# Patient Record
Sex: Male | Born: 1955 | Race: White | Hispanic: No | Marital: Single | Smoking: Current every day smoker
Health system: Southern US, Community
[De-identification: ages and names within clinical notes are randomized; demographics above are authoritative.]

## PROBLEM LIST (undated history)

## (undated) DIAGNOSIS — I10 Essential (primary) hypertension: Secondary | ICD-10-CM

## (undated) DIAGNOSIS — M1990 Unspecified osteoarthritis, unspecified site: Secondary | ICD-10-CM

## (undated) DIAGNOSIS — Z800 Family history of malignant neoplasm of digestive organs: Secondary | ICD-10-CM

## (undated) DIAGNOSIS — D126 Benign neoplasm of colon, unspecified: Secondary | ICD-10-CM

## (undated) DIAGNOSIS — D1391 Familial adenomatous polyposis: Secondary | ICD-10-CM

## (undated) DIAGNOSIS — J449 Chronic obstructive pulmonary disease, unspecified: Secondary | ICD-10-CM

## (undated) DIAGNOSIS — H9319 Tinnitus, unspecified ear: Secondary | ICD-10-CM

## (undated) DIAGNOSIS — J189 Pneumonia, unspecified organism: Secondary | ICD-10-CM

## (undated) HISTORY — DX: Benign neoplasm of colon, unspecified: D12.6

## (undated) HISTORY — DX: Family history of malignant neoplasm of digestive organs: Z80.0

## (undated) HISTORY — DX: Essential (primary) hypertension: I10

## (undated) HISTORY — DX: Familial adenomatous polyposis: D13.91

## (undated) HISTORY — PX: MULTIPLE TOOTH EXTRACTIONS: SHX2053

---

## 2004-06-29 ENCOUNTER — Ambulatory Visit: Payer: Self-pay | Admitting: Internal Medicine

## 2004-06-29 ENCOUNTER — Inpatient Hospital Stay (HOSPITAL_COMMUNITY): Admission: AD | Admit: 2004-06-29 | Discharge: 2004-07-15 | Payer: Self-pay

## 2004-06-29 ENCOUNTER — Encounter: Payer: Self-pay | Admitting: Emergency Medicine

## 2004-07-30 ENCOUNTER — Ambulatory Visit: Payer: Self-pay | Admitting: Critical Care Medicine

## 2004-08-28 ENCOUNTER — Encounter: Admission: RE | Admit: 2004-08-28 | Discharge: 2004-08-28 | Payer: Self-pay | Admitting: Critical Care Medicine

## 2004-09-05 ENCOUNTER — Ambulatory Visit: Payer: Self-pay | Admitting: Critical Care Medicine

## 2010-08-17 ENCOUNTER — Encounter: Payer: Self-pay | Admitting: Thoracic Surgery

## 2010-08-18 ENCOUNTER — Encounter: Payer: Self-pay | Admitting: Thoracic Surgery

## 2010-08-18 ENCOUNTER — Encounter: Payer: Self-pay | Admitting: Critical Care Medicine

## 2014-04-18 ENCOUNTER — Other Ambulatory Visit (HOSPITAL_COMMUNITY): Payer: Self-pay | Admitting: Family Medicine

## 2014-04-18 ENCOUNTER — Ambulatory Visit (HOSPITAL_COMMUNITY)
Admission: RE | Admit: 2014-04-18 | Discharge: 2014-04-18 | Disposition: A | Discharge status: Home / Self Care | Payer: 59 | Source: Ambulatory Visit | Attending: Family Medicine | Admitting: Family Medicine

## 2014-04-18 DIAGNOSIS — R079 Chest pain, unspecified: Secondary | ICD-10-CM | POA: Diagnosis not present

## 2014-04-18 DIAGNOSIS — R918 Other nonspecific abnormal finding of lung field: Secondary | ICD-10-CM

## 2014-04-18 DIAGNOSIS — J449 Chronic obstructive pulmonary disease, unspecified: Secondary | ICD-10-CM | POA: Insufficient documentation

## 2014-04-18 DIAGNOSIS — J4489 Other specified chronic obstructive pulmonary disease: Secondary | ICD-10-CM | POA: Insufficient documentation

## 2015-07-10 ENCOUNTER — Ambulatory Visit (HOSPITAL_COMMUNITY)
Admission: RE | Admit: 2015-07-10 | Discharge: 2015-07-10 | Disposition: A | Discharge status: Home / Self Care | Payer: Managed Care, Other (non HMO) | Source: Ambulatory Visit | Attending: Family Medicine | Admitting: Family Medicine

## 2015-07-10 ENCOUNTER — Other Ambulatory Visit (HOSPITAL_COMMUNITY): Payer: Self-pay | Admitting: Family Medicine

## 2015-07-10 DIAGNOSIS — R05 Cough: Secondary | ICD-10-CM | POA: Diagnosis present

## 2015-07-10 DIAGNOSIS — R918 Other nonspecific abnormal finding of lung field: Secondary | ICD-10-CM

## 2016-04-29 ENCOUNTER — Ambulatory Visit (INDEPENDENT_AMBULATORY_CARE_PROVIDER_SITE_OTHER): Payer: Managed Care, Other (non HMO)

## 2016-04-29 DIAGNOSIS — Z23 Encounter for immunization: Secondary | ICD-10-CM | POA: Diagnosis not present

## 2017-05-20 DIAGNOSIS — Z23 Encounter for immunization: Secondary | ICD-10-CM | POA: Diagnosis not present

## 2019-05-18 ENCOUNTER — Other Ambulatory Visit: Payer: Self-pay

## 2019-05-18 DIAGNOSIS — Z20822 Contact with and (suspected) exposure to covid-19: Secondary | ICD-10-CM

## 2019-05-19 LAB — NOVEL CORONAVIRUS, NAA: SARS-CoV-2, NAA: NOT DETECTED

## 2019-05-23 ENCOUNTER — Telehealth: Payer: Self-pay | Admitting: Hematology

## 2019-05-23 NOTE — Telephone Encounter (Signed)
Patient called in stating his employer is needing results faxed to [phone] or [phone], as he will not accept it off of mychart. Please advise and place to attention of Ileene Musa.

## 2019-05-23 NOTE — Telephone Encounter (Signed)
Pt is aware covid 19 result is neg

## 2019-08-29 ENCOUNTER — Encounter: Payer: Self-pay | Admitting: Internal Medicine

## 2019-09-13 ENCOUNTER — Encounter: Payer: Self-pay | Admitting: Internal Medicine

## 2019-09-13 ENCOUNTER — Telehealth: Payer: Self-pay | Admitting: Internal Medicine

## 2019-09-13 ENCOUNTER — Ambulatory Visit: Payer: Managed Care, Other (non HMO) | Admitting: Gastroenterology

## 2019-09-13 NOTE — Progress Notes (Deleted)
Primary Care Physician:  Lucia Gaskins, MD  Primary Gastroenterologist:  Garfield Cornea, MD   No chief complaint on file.   HPI:  Jason Mclaughlin is a 64 y.o. male here   No current outpatient medications on file.   No current facility-administered medications for this visit.    Allergies as of 09/13/2019  . (Not on File)    No past medical history on file.  *** The histories are not reviewed yet. Please review them in the "History" navigator section and refresh this [place].  No family history on file.  Social History   Socioeconomic History  . Marital status: Single    Spouse name: Not on file  . Number of children: Not on file  . Years of education: Not on file  . Highest education level: Not on file  Occupational History  . Not on file  Tobacco Use  . Smoking status: Not on file  Substance and Sexual Activity  . Alcohol use: Not on file  . Drug use: Not on file  . Sexual activity: Not on file  Other Topics Concern  . Not on file  Social History Narrative  . Not on file   Social Determinants of Health   Financial Resource Strain:   . Difficulty of Paying Living Expenses: Not on file  Food Insecurity:   . Worried About Charity fundraiser in the Last Year: Not on file  . Ran Out of Food in the Last Year: Not on file  Transportation Needs:   . Lack of Transportation (Medical): Not on file  . Lack of Transportation (Non-Medical): Not on file  Physical Activity:   . Days of Exercise per Week: Not on file  . Minutes of Exercise per Session: Not on file  Stress:   . Feeling of Stress : Not on file  Social Connections:   . Frequency of Communication with Friends and Family: Not on file  . Frequency of Social Gatherings with Friends and Family: Not on file  . Attends Religious Services: Not on file  . Active Member of Clubs or Organizations: Not on file  . Attends Archivist Meetings: Not on file  . Marital Status: Not on file  Intimate  Partner Violence:   . Fear of Current or Ex-Partner: Not on file  . Emotionally Abused: Not on file  . Physically Abused: Not on file  . Sexually Abused: Not on file      ROS:  General: Negative for anorexia, weight loss, fever, chills, fatigue, weakness. Eyes: Negative for vision changes.  ENT: Negative for hoarseness, difficulty swallowing , nasal congestion. CV: Negative for chest pain, angina, palpitations, dyspnea on exertion, peripheral edema.  Respiratory: Negative for dyspnea at rest, dyspnea on exertion, cough, sputum, wheezing.  GI: See history of present illness. GU:  Negative for dysuria, hematuria, urinary incontinence, urinary frequency, nocturnal urination.  MS: Negative for joint pain, low back pain.  Derm: Negative for rash or itching.  Neuro: Negative for weakness, abnormal sensation, seizure, frequent headaches, memory loss, confusion.  Psych: Negative for anxiety, depression, suicidal ideation, hallucinations.  Endo: Negative for unusual weight change.  Heme: Negative for bruising or bleeding. Allergy: Negative for rash or hives.    Physical Examination:  There were no vitals taken for this visit.   General: Well-nourished, well-developed in no acute distress.  Head: Normocephalic, atraumatic.   Eyes: Conjunctiva pink, no icterus. Mouth: Oropharyngeal mucosa moist and pink , no lesions erythema or exudate. Neck: Supple  without thyromegaly, masses, or lymphadenopathy.  Lungs: Clear to auscultation bilaterally.  Heart: Regular rate and rhythm, no murmurs rubs or gallops.  Abdomen: Bowel sounds are normal, nontender, nondistended, no hepatosplenomegaly or masses, no abdominal bruits or    hernia , no rebound or guarding.   Rectal: *** Extremities: No lower extremity edema. No clubbing or deformities.  Neuro: Alert and oriented x 4 , grossly normal neurologically.  Skin: Warm and dry, no rash or jaundice.   Psych: Alert and cooperative, normal mood and  affect.  Labs: ***  Imaging Studies: No results found.

## 2019-09-13 NOTE — Telephone Encounter (Signed)
Patient was a no show and letter sent  °

## 2019-10-20 ENCOUNTER — Ambulatory Visit: Payer: Managed Care, Other (non HMO) | Attending: Internal Medicine

## 2019-10-20 DIAGNOSIS — Z23 Encounter for immunization: Secondary | ICD-10-CM

## 2019-10-20 NOTE — Progress Notes (Signed)
   Covid-19 Vaccination Clinic  Name:  Jason Mclaughlin    MRN: [id number] DOB: 1955/10/02  10/20/2019  [name] was observed post Covid-19 immunization for 15 minutes without incident. He was provided with Vaccine Information Sheet and instruction to access the V-Safe system.   [name] was instructed to call 911 with any severe reactions post vaccine: Marland Kitchen Difficulty breathing  . Swelling of face and throat  . A fast heartbeat  . A bad rash all over body  . Dizziness and weakness   Immunizations Administered    Name Date Dose VIS Date Route   Moderna COVID-19 Vaccine 10/20/2019  8:42 AM 0.5 mL 06/28/2019 Intramuscular   Manufacturer: Moderna   Lot: [id number]   [id number]

## 2019-11-16 ENCOUNTER — Ambulatory Visit: Payer: Managed Care, Other (non HMO) | Attending: Internal Medicine

## 2019-11-16 DIAGNOSIS — Z23 Encounter for immunization: Secondary | ICD-10-CM

## 2019-11-16 NOTE — Progress Notes (Signed)
   Covid-19 Vaccination Clinic  Name:  BRADLY BOZZUTO    MRN: [id number] DOB: Jun 29, 1956  11/16/2019  Mr. Sivers was observed post Covid-19 immunization for 15 minutes without incident. He was provided with Vaccine Information Sheet and instruction to access the V-Safe system.   Mr. Prichard was instructed to call 911 with any severe reactions post vaccine: Marland Kitchen Difficulty breathing  . Swelling of face and throat  . A fast heartbeat  . A bad rash all over body  . Dizziness and weakness   Immunizations Administered    Name Date Dose VIS Date Route   Moderna COVID-19 Vaccine 11/16/2019  8:11 AM 0.5 mL 06/2019 Intramuscular   Manufacturer: Moderna   Lot: [id number]   [id number]

## 2019-11-23 ENCOUNTER — Ambulatory Visit: Payer: Managed Care, Other (non HMO)

## 2021-10-09 ENCOUNTER — Other Ambulatory Visit: Payer: Self-pay

## 2021-10-09 ENCOUNTER — Ambulatory Visit (HOSPITAL_COMMUNITY)
Admission: RE | Admit: 2021-10-09 | Discharge: 2021-10-09 | Disposition: A | Discharge status: Home / Self Care | Payer: Medicare Other | Source: Ambulatory Visit | Attending: Internal Medicine | Admitting: Internal Medicine

## 2021-10-09 ENCOUNTER — Other Ambulatory Visit (HOSPITAL_COMMUNITY): Payer: Self-pay | Admitting: Internal Medicine

## 2021-10-09 DIAGNOSIS — R911 Solitary pulmonary nodule: Secondary | ICD-10-CM | POA: Insufficient documentation

## 2021-10-14 ENCOUNTER — Encounter: Payer: Self-pay | Admitting: Internal Medicine

## 2021-10-23 NOTE — Progress Notes (Signed)
? ? ?GI Office Note   ? ?Referring Provider: Adaline Sill, NP ?Primary Care Physician:  Adaline Sill, NP  ?Primary GI:  Dr. Gala Romney  ? ?Chief Complaint  ? ?Chief Complaint  ?Patient presents with  ? Diarrhea  ? ? ? ?History of Present Illness  ? ?Jason Mclaughlin is a 66 y.o. male with history of hypertension presenting today at the request of his PCP for consultation regarding diarrhea and need for screening colonoscopy ? ?Diarrhea ?Patient complains of diarrhea. Symptoms have been present for approximately  1-2  years. The symptoms are unchanged. Stool frequency is approximately  6-8  times per day. Patient estimates stool volume to be  small at times and larger other times . Diarrhea is mostly watery consistency with some mucous. Diarrhea does occur at night. The patient has noted no bleeding associated with bowel movements. The patient also reports the following symptoms: bloating, fecal incontinence, fecal urgency, flatulence, mucus with stools, nocturnal diarrhea, small volume diarrhea, watery diarrhea, and fatigue . The patient denies the following symptoms: acid reflux,abdominal cramping relieved by defecation, diarrhea alternating with constipation, fever, greasy stool, localized abdominal pain, melena, and weight loss. The patient currently denies significant abdominal pain or discomfort.   Relationship to food: the patient denies any relationship to food. Relationship to medications: none. Other risk factors: none. Therapy tried so far: metamucil, Pepto Bismol, Citrucel. Work up so far: none.  ? ?Diet - beans, greens, cornbread, red meats, oatmeal, cereal. Has very little dairy products. Stopped drinking coffee, gatorade, only has 1 beer a day. ? 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 Prior to this he admits being in a very stressful job. ? ?Last colonoscopy - Never had a colonoscopy ? ? ?Past Medical History:  ?Diagnosis Date  ? Hypertension    ? ? ?History reviewed. No pertinent surgical history. ? ? ?Current Outpatient Medications  ?Medication Sig Dispense Refill  ? amLODipine (NORVASC) 10 MG tablet Take 10 mg by mouth daily.    ? loratadine (CLARITIN) 10 MG tablet Take 10 mg by mouth daily.    ? multivitamin-iron-minerals-folic acid (CENTRUM) chewable tablet Chew 1 tablet by mouth daily.    ? psyllium (METAMUCIL) 58.6 % powder Take 1 packet by mouth daily.    ? tadalafil (CIALIS) 5 MG tablet Take 5 mg by mouth daily as needed.    ? ?No current facility-administered medications for this visit.  ? ? ?Allergies as of 10/24/2021  ? (Not on File)  ? ? ?Family History  ?Problem Relation Age of Onset  ? Lung cancer Mother   ? Colon cancer Father   ? ? ?Social History  ? ?Socioeconomic History  ? Marital status: Single  ?  Spouse name: Not on file  ? Number of children: Not on file  ? Years of education: Not on file  ? Highest education level: Not on file  ?Occupational History  ? Not on file  ?Tobacco Use  ? Smoking status: Every Day  ?  Packs/day: 1.00  ?  Types: Cigarettes  ? Smokeless tobacco: Never  ?Vaping Use  ? Vaping Use: Not on file  ?Substance and Sexual Activity  ? Alcohol use: Yes  ?  Alcohol/week: 7.0 standard drinks  ?  Types: 7 Cans of beer per week  ? Drug use: Never  ?  Comment: CBD  ? Sexual activity: Never  ?Other Topics Concern  ? Not on file  ?  Social History Narrative  ? ** Merged History Encounter **  ?    ? ?Social Determinants of Health  ? ?Financial Resource Strain: Not on file  ?Food Insecurity: Not on file  ?Transportation Needs: Not on file  ?Physical Activity: Not on file  ?Stress: Not on file  ?Social Connections: Not on file  ?Intimate Partner Violence: Not on file  ? ? ? ?Review of Systems  ? ?Gen: + fatigue. Denies any fever, chills, weight loss, lack of appetite.  ?CV: Denies chest pain, heart palpitations, peripheral edema, syncope.  ?Resp: Denies shortness of breath at rest or with exertion. Denies wheezing or cough.  ?GI:  see HPI ?GU : Denies urinary burning, urinary frequency, urinary hesitancy ?MS: Denies joint pain, muscle weakness, cramps, or limitation of movement.  ?Derm: Denies rash, itching, dry skin ?Psych: Denies depression, anxiety, memory loss, and confusion ?Heme: Denies bruising, bleeding, and enlarged lymph nodes. ? ? ?Physical Exam  ? ?BP (!) 155/88   Pulse 86   Temp (!) 97.4 ?F (36.3 ?C) (Temporal)   Ht '5\' 9"'$  (1.753 m)   Wt 165 lb (74.8 kg)   BMI 24.37 kg/m?  ? ?General:   Alert and oriented. Pleasant and cooperative. Well-nourished and well-developed.  ?Head:  Normocephalic and atraumatic. ?Eyes:  Without icterus, sclera clear and conjunctiva pink.  ?Ears:  Normal auditory acuity. ?Mouth:  No deformity or lesions, oral mucosa pink.  ?Lungs:  Clear to auscultation bilaterally. No wheezes, rales, or rhonchi. No distress.  ?Heart:  S1, S2 present without murmurs appreciated.  ?Abdomen:  +BS, soft, mild periumbilical TTP, non-distended. No HSM noted. No guarding or rebound. No masses appreciated.  ?Rectal:  Deferred  ?Msk:  Symmetrical without gross deformities. Normal posture. ?Extremities:  Without edema. ?Neurologic:  Alert and  oriented x4;  grossly normal neurologically. ?Skin:  Intact without significant lesions or rashes. ?Psych:  Alert and cooperative. Normal mood and affect. ? ? ?Assessment  ? ?Jason Mclaughlin is a 66 y.o. male with a history of hypertension presenting today with chronic diarrhea ? ?Chronic Diarrhea: He has been having issues with diarrhea for the last 1 to 2 years.  Has been putting this off and has been dealing with it at home.  Has been having small-volume large-volume diarrhea with a total of about 6-8 bowel movements a day.  He is also having fecal incontinence and nocturnal stools.  Stool consistency is mostly watery with some clear mucus.  He denies any melena, abdominal pain, or hematochezia.  He does have a family history of colon cancer in his father is deceased.  He has never  had a colonoscopy in the past, was scheduled in 2020 and canceled as he has been nervous due to feelings of feeling violated.  He denies any sick contacts, exposure to fecal matter, recent antibiotic use.  He reports prior to 2 years ago he had daily formed stools.  Given his alarm symptoms and family history of colon cancer, we will proceed with colonoscopy with propofol by Dr. Gala Romney in the near future.  We will also complete work-up with GI pathogen panel, C. difficile, fecal calprotectin, CBC, CMP, TSH, IgA, TTG IgA for evaluation of infectious, inflammatory, and endocrine etiologies such as celiac, Crohn's, bacterial gastroenteritis, hyperthyroidism.  Recommended dietary changes with bland starchy foods, avoiding dairy, high-fiber, fatty foods.  Will consider loperamide after stool testing and colonoscopy is performed. ? ? ?PLAN  ? ?Proceed with colonoscopy with propofol by Dr. Gala Romney in near future: the  risks, benefits, and alternatives have been discussed with the patient in detail. The patient states understanding and desires to proceed. ASA 2 ?GI profile, C-diff, fecal calprotectin ?CBC, CMP,TSH, IgA, TtG IgA ?Will consider loperamide if stool studies negative. ?Dietary changes with avoiding dairy, high fiber, and fatty foods. ?Blander diet with chicken, rice, crackers, toast, oatmeal, etc. ? ? ?Venetia Night, MSN, FNP-BC, AGACNP-BC ?[facility] ? ?

## 2021-10-23 NOTE — H&P (View-Only) (Signed)
? ? ?GI Office Note   ? ?Referring Provider: Adaline Sill, NP ?Primary Care Physician:  Adaline Sill, NP  ?Primary GI:  Dr. Gala Romney  ? ?Chief Complaint  ? ?Chief Complaint  ?Patient presents with  ? Diarrhea  ? ? ? ?History of Present Illness  ? ?Jason Mclaughlin is a 66 y.o. male with history of hypertension presenting today at the request of his PCP for consultation regarding diarrhea and need for screening colonoscopy ? ?Diarrhea ?Patient complains of diarrhea. Symptoms have been present for approximately  1-2  years. The symptoms are unchanged. Stool frequency is approximately  6-8  times per day. Patient estimates stool volume to be  small at times and larger other times . Diarrhea is mostly watery consistency with some mucous. Diarrhea does occur at night. The patient has noted no bleeding associated with bowel movements. The patient also reports the following symptoms: bloating, fecal incontinence, fecal urgency, flatulence, mucus with stools, nocturnal diarrhea, small volume diarrhea, watery diarrhea, and fatigue . The patient denies the following symptoms: acid reflux,abdominal cramping relieved by defecation, diarrhea alternating with constipation, fever, greasy stool, localized abdominal pain, melena, and weight loss. The patient currently denies significant abdominal pain or discomfort.   Relationship to food: the patient denies any relationship to food. Relationship to medications: none. Other risk factors: none. Therapy tried so far: metamucil, Pepto Bismol, Citrucel. Work up so far: none.  ? ?Diet - beans, greens, cornbread, red meats, oatmeal, cereal. Has very little dairy products. Stopped drinking coffee, gatorade, only has 1 beer a day. ? 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 Prior to this he admits being in a very stressful job. ? ?Last colonoscopy - Never had a colonoscopy ? ? ?Past Medical History:  ?Diagnosis Date  ? Hypertension    ? ? ?History reviewed. No pertinent surgical history. ? ? ?Current Outpatient Medications  ?Medication Sig Dispense Refill  ? amLODipine (NORVASC) 10 MG tablet Take 10 mg by mouth daily.    ? loratadine (CLARITIN) 10 MG tablet Take 10 mg by mouth daily.    ? multivitamin-iron-minerals-folic acid (CENTRUM) chewable tablet Chew 1 tablet by mouth daily.    ? psyllium (METAMUCIL) 58.6 % powder Take 1 packet by mouth daily.    ? tadalafil (CIALIS) 5 MG tablet Take 5 mg by mouth daily as needed.    ? ?No current facility-administered medications for this visit.  ? ? ?Allergies as of 10/24/2021  ? (Not on File)  ? ? ?Family History  ?Problem Relation Age of Onset  ? Lung cancer Mother   ? Colon cancer Father   ? ? ?Social History  ? ?Socioeconomic History  ? Marital status: Single  ?  Spouse name: Not on file  ? Number of children: Not on file  ? Years of education: Not on file  ? Highest education level: Not on file  ?Occupational History  ? Not on file  ?Tobacco Use  ? Smoking status: Every Day  ?  Packs/day: 1.00  ?  Types: Cigarettes  ? Smokeless tobacco: Never  ?Vaping Use  ? Vaping Use: Not on file  ?Substance and Sexual Activity  ? Alcohol use: Yes  ?  Alcohol/week: 7.0 standard drinks  ?  Types: 7 Cans of beer per week  ? Drug use: Never  ?  Comment: CBD  ? Sexual activity: Never  ?Other Topics Concern  ? Not on file  ?  Social History Narrative  ? ** Merged History Encounter **  ?    ? ?Social Determinants of Health  ? ?Financial Resource Strain: Not on file  ?Food Insecurity: Not on file  ?Transportation Needs: Not on file  ?Physical Activity: Not on file  ?Stress: Not on file  ?Social Connections: Not on file  ?Intimate Partner Violence: Not on file  ? ? ? ?Review of Systems  ? ?Gen: + fatigue. Denies any fever, chills, weight loss, lack of appetite.  ?CV: Denies chest pain, heart palpitations, peripheral edema, syncope.  ?Resp: Denies shortness of breath at rest or with exertion. Denies wheezing or cough.  ?GI:  see HPI ?GU : Denies urinary burning, urinary frequency, urinary hesitancy ?MS: Denies joint pain, muscle weakness, cramps, or limitation of movement.  ?Derm: Denies rash, itching, dry skin ?Psych: Denies depression, anxiety, memory loss, and confusion ?Heme: Denies bruising, bleeding, and enlarged lymph nodes. ? ? ?Physical Exam  ? ?BP (!) 155/88   Pulse 86   Temp (!) 97.4 ?F (36.3 ?C) (Temporal)   Ht '5\' 9"'$  (1.753 m)   Wt 165 lb (74.8 kg)   BMI 24.37 kg/m?  ? ?General:   Alert and oriented. Pleasant and cooperative. Well-nourished and well-developed.  ?Head:  Normocephalic and atraumatic. ?Eyes:  Without icterus, sclera clear and conjunctiva pink.  ?Ears:  Normal auditory acuity. ?Mouth:  No deformity or lesions, oral mucosa pink.  ?Lungs:  Clear to auscultation bilaterally. No wheezes, rales, or rhonchi. No distress.  ?Heart:  S1, S2 present without murmurs appreciated.  ?Abdomen:  +BS, soft, mild periumbilical TTP, non-distended. No HSM noted. No guarding or rebound. No masses appreciated.  ?Rectal:  Deferred  ?Msk:  Symmetrical without gross deformities. Normal posture. ?Extremities:  Without edema. ?Neurologic:  Alert and  oriented x4;  grossly normal neurologically. ?Skin:  Intact without significant lesions or rashes. ?Psych:  Alert and cooperative. Normal mood and affect. ? ? ?Assessment  ? ?Jason Mclaughlin is a 66 y.o. male with a history of hypertension presenting today with chronic diarrhea ? ?Chronic Diarrhea: He has been having issues with diarrhea for the last 1 to 2 years.  Has been putting this off and has been dealing with it at home.  Has been having small-volume large-volume diarrhea with a total of about 6-8 bowel movements a day.  He is also having fecal incontinence and nocturnal stools.  Stool consistency is mostly watery with some clear mucus.  He denies any melena, abdominal pain, or hematochezia.  He does have a family history of colon cancer in his father is deceased.  He has never  had a colonoscopy in the past, was scheduled in 2020 and canceled as he has been nervous due to feelings of feeling violated.  He denies any sick contacts, exposure to fecal matter, recent antibiotic use.  He reports prior to 2 years ago he had daily formed stools.  Given his alarm symptoms and family history of colon cancer, we will proceed with colonoscopy with propofol by Dr. Gala Romney in the near future.  We will also complete work-up with GI pathogen panel, C. difficile, fecal calprotectin, CBC, CMP, TSH, IgA, TTG IgA for evaluation of infectious, inflammatory, and endocrine etiologies such as celiac, Crohn's, bacterial gastroenteritis, hyperthyroidism.  Recommended dietary changes with bland starchy foods, avoiding dairy, high-fiber, fatty foods.  Will consider loperamide after stool testing and colonoscopy is performed. ? ? ?PLAN  ? ?Proceed with colonoscopy with propofol by Dr. Gala Romney in near future: the  risks, benefits, and alternatives have been discussed with the patient in detail. The patient states understanding and desires to proceed. ASA 2 ?GI profile, C-diff, fecal calprotectin ?CBC, CMP,TSH, IgA, TtG IgA ?Will consider loperamide if stool studies negative. ?Dietary changes with avoiding dairy, high fiber, and fatty foods. ?Blander diet with chicken, rice, crackers, toast, oatmeal, etc. ? ? ?Venetia Night, MSN, FNP-BC, AGACNP-BC ?[facility] ? ?

## 2021-10-24 ENCOUNTER — Telehealth: Payer: Self-pay | Admitting: Gastroenterology

## 2021-10-24 ENCOUNTER — Other Ambulatory Visit: Payer: Self-pay

## 2021-10-24 ENCOUNTER — Encounter: Payer: Self-pay | Admitting: Gastroenterology

## 2021-10-24 ENCOUNTER — Telehealth: Payer: Self-pay

## 2021-10-24 ENCOUNTER — Ambulatory Visit (INDEPENDENT_AMBULATORY_CARE_PROVIDER_SITE_OTHER): Payer: Medicare Other | Admitting: Gastroenterology

## 2021-10-24 VITALS — BP 155/88 | HR 86 | Temp 97.4°F | Ht 69.0 in | Wt 165.0 lb

## 2021-10-24 DIAGNOSIS — K529 Noninfective gastroenteritis and colitis, unspecified: Secondary | ICD-10-CM

## 2021-10-24 DIAGNOSIS — Z800 Family history of malignant neoplasm of digestive organs: Secondary | ICD-10-CM

## 2021-10-24 MED ORDER — PEG 3350-KCL-NA BICARB-NACL 420 G PO SOLR: 4000.0000 mL | ORAL | 0 refills | Status: DC

## 2021-10-24 NOTE — Patient Instructions (Addendum)
As discussed we are scheduling you for a colonoscopy with propofol in the near future with Dr. Gala Romney.  ? ?I will consider sending in imodium for you after I received your lab work and stool studies back. I would recommend a more bland diet with some starches like crackers, toast, oatmeal, chicken, rice. Try avoiding fatty foods and dairy products as well as foods high in fiber (beans).  ? ?I will be ordering lab work and stool studies for you to have done at [facility]. ? ? ?It was a pleasure to see you today. I want to create trusting relationships with patients. If you receive a survey regarding your visit,  I greatly appreciate you taking time to fill this out on paper or through your MyChart. I value your feedback. ? ?Venetia Night, MSN, FNP-BC, AGACNP-BC ?[facility] ? ? ?

## 2021-10-24 NOTE — Telephone Encounter (Signed)
Jason Mclaughlin - Do you mind calling [facility] to see if we can add-on and iron panel with B12 and folate to this gentleman's labs.  ? ?Venetia Night, MSN, FNP-BC, AGACNP-BC ?[facility] ? ?

## 2021-10-24 NOTE — Telephone Encounter (Signed)
PA for TCS submitted via [facility] website. PA# [id number], valid 10/28/21-01/26/22. ?

## 2021-10-25 NOTE — Telephone Encounter (Signed)
Spoke with [facility] and added on fe+TIBC+fer and B12 and folate panel.  ?

## 2021-10-28 ENCOUNTER — Ambulatory Visit (HOSPITAL_BASED_OUTPATIENT_CLINIC_OR_DEPARTMENT_OTHER): Payer: Medicare Other | Admitting: Anesthesiology

## 2021-10-28 ENCOUNTER — Encounter (HOSPITAL_COMMUNITY)
Admission: RE | Disposition: A | Discharge status: Home / Self Care | Payer: Self-pay | Source: Home / Self Care | Attending: Internal Medicine

## 2021-10-28 ENCOUNTER — Ambulatory Visit (HOSPITAL_COMMUNITY): Payer: Medicare Other | Admitting: Anesthesiology

## 2021-10-28 ENCOUNTER — Ambulatory Visit (HOSPITAL_COMMUNITY)
Admission: RE | Admit: 2021-10-28 | Discharge: 2021-10-28 | Disposition: A | Discharge status: Home / Self Care | Payer: Medicare Other | Attending: Internal Medicine | Admitting: Internal Medicine

## 2021-10-28 ENCOUNTER — Other Ambulatory Visit: Payer: Self-pay

## 2021-10-28 ENCOUNTER — Encounter (HOSPITAL_COMMUNITY): Payer: Self-pay | Admitting: Internal Medicine

## 2021-10-28 DIAGNOSIS — D123 Benign neoplasm of transverse colon: Secondary | ICD-10-CM | POA: Insufficient documentation

## 2021-10-28 DIAGNOSIS — D490 Neoplasm of unspecified behavior of digestive system: Secondary | ICD-10-CM

## 2021-10-28 DIAGNOSIS — K635 Polyp of colon: Secondary | ICD-10-CM

## 2021-10-28 DIAGNOSIS — I10 Essential (primary) hypertension: Secondary | ICD-10-CM | POA: Diagnosis not present

## 2021-10-28 DIAGNOSIS — K621 Rectal polyp: Secondary | ICD-10-CM | POA: Diagnosis not present

## 2021-10-28 DIAGNOSIS — Z1211 Encounter for screening for malignant neoplasm of colon: Secondary | ICD-10-CM

## 2021-10-28 DIAGNOSIS — D125 Benign neoplasm of sigmoid colon: Secondary | ICD-10-CM | POA: Diagnosis not present

## 2021-10-28 DIAGNOSIS — F17210 Nicotine dependence, cigarettes, uncomplicated: Secondary | ICD-10-CM | POA: Insufficient documentation

## 2021-10-28 DIAGNOSIS — R197 Diarrhea, unspecified: Secondary | ICD-10-CM | POA: Diagnosis present

## 2021-10-28 HISTORY — PX: POLYPECTOMY: SHX5525

## 2021-10-28 HISTORY — PX: COLONOSCOPY WITH PROPOFOL: SHX5780

## 2021-10-28 HISTORY — PX: BIOPSY: SHX5522

## 2021-10-28 HISTORY — PX: HOT HEMOSTASIS: SHX5433

## 2021-10-28 LAB — SPECIMEN STATUS REPORT

## 2021-10-28 SURGERY — COLONOSCOPY WITH PROPOFOL
Anesthesia: General

## 2021-10-28 MED ORDER — LACTATED RINGERS IV SOLN: INTRAVENOUS | Status: DC

## 2021-10-28 MED ORDER — GLUCAGON HCL RDNA (DIAGNOSTIC) 1 MG IJ SOLR
INTRAMUSCULAR | Status: AC
Start: 2021-10-28 — End: ?
  Filled 2021-10-28: qty 1

## 2021-10-28 MED ORDER — PHENYLEPHRINE HCL (PRESSORS) 10 MG/ML IV SOLN
INTRAVENOUS | Status: DC | PRN
  Administered 2021-10-28: 80 ug via INTRAVENOUS
  Administered 2021-10-28: 120 ug via INTRAVENOUS
  Administered 2021-10-28: 80 ug via INTRAVENOUS

## 2021-10-28 MED ORDER — GLUCAGON HCL RDNA (DIAGNOSTIC) 1 MG IJ SOLR
INTRAMUSCULAR | Status: DC | PRN
  Administered 2021-10-28: .5 mg via INTRAVENOUS

## 2021-10-28 MED ORDER — PROPOFOL 500 MG/50ML IV EMUL
INTRAVENOUS | Status: AC
  Filled 2021-10-28: qty 150

## 2021-10-28 MED ORDER — PHENYLEPHRINE 40 MCG/ML (10ML) SYRINGE FOR IV PUSH (FOR BLOOD PRESSURE SUPPORT)
PREFILLED_SYRINGE | INTRAVENOUS | Status: AC
  Filled 2021-10-28: qty 10

## 2021-10-28 MED ORDER — EPHEDRINE 5 MG/ML INJ
INTRAVENOUS | Status: AC
  Filled 2021-10-28: qty 5

## 2021-10-28 MED ORDER — PROPOFOL 500 MG/50ML IV EMUL
INTRAVENOUS | Status: DC | PRN
  Administered 2021-10-28: 225 ug/kg/min via INTRAVENOUS

## 2021-10-28 MED ORDER — STERILE WATER FOR IRRIGATION IR SOLN
Status: DC | PRN
  Administered 2021-10-28: 60 mL

## 2021-10-28 MED ORDER — LACTATED RINGERS IV SOLN: INTRAVENOUS | Status: DC | PRN

## 2021-10-28 MED ORDER — LIDOCAINE HCL (PF) 2 % IJ SOLN
INTRAMUSCULAR | Status: AC
  Filled 2021-10-28: qty 55

## 2021-10-28 MED ORDER — PROPOFOL 10 MG/ML IV BOLUS
INTRAVENOUS | Status: DC | PRN
  Administered 2021-10-28: 120 mg via INTRAVENOUS

## 2021-10-28 MED ORDER — LIDOCAINE HCL (CARDIAC) PF 100 MG/5ML IV SOSY
PREFILLED_SYRINGE | INTRAVENOUS | Status: DC | PRN
  Administered 2021-10-28: 60 mg via INTRATRACHEAL

## 2021-10-28 MED ORDER — PROPOFOL 1000 MG/100ML IV EMUL
INTRAVENOUS | Status: AC
  Filled 2021-10-28: qty 400

## 2021-10-28 MED ORDER — EPHEDRINE SULFATE (PRESSORS) 50 MG/ML IJ SOLN
INTRAMUSCULAR | Status: DC | PRN
  Administered 2021-10-28 (×2): 5 mg via INTRAVENOUS

## 2021-10-28 MED ORDER — PROPOFOL 10 MG/ML IV BOLUS
INTRAVENOUS | Status: AC
  Filled 2021-10-28: qty 40

## 2021-10-28 NOTE — Transfer of Care (Signed)
Immediate Anesthesia Transfer of Care Note ? ?Patient: SAHITH NURSE ? ?Procedure(s) Performed: COLONOSCOPY WITH PROPOFOL ?HOT HEMOSTASIS (ARGON PLASMA COAGULATION/BICAP) ?POLYPECTOMY ?BIOPSY ? ?Patient Location: Short Stay ? ?Anesthesia Type:General ? ?Level of Consciousness: awake ? ?Airway & Oxygen Therapy: Patient Spontanous Breathing ? ?Post-op Assessment: Report given to RN and Post -op Vital signs reviewed and stable ? ?Post vital signs: Reviewed and stable ? ?Last Vitals:  ?Vitals Value Taken Time  ?BP    ?Temp    ?Pulse    ?Resp    ?SpO2    ? ? ?Last Pain:  ?Vitals:  ? 10/28/21 1315  ?TempSrc:   ?PainSc: 0-No pain  ?   ? ?Patients Stated Pain Goal: 7 (10/28/21 1257) ? ?Complications: No notable events documented. ?

## 2021-10-28 NOTE — Interval H&P Note (Signed)
History and Physical Interval Note: ? ?10/28/2021 ?12:50 PM ? ?Jason Mclaughlin  has presented today for surgery, with the diagnosis of screening colonoscopy.  The various methods of treatment have been discussed with the patient and family. After consideration of risks, benefits and other options for treatment, the patient has consented to  Procedure(s) with comments: ?COLONOSCOPY WITH PROPOFOL (N/A) - 2:15pm as a surgical intervention.  The patient's history has been reviewed, patient examined, no change in status, stable for surgery.  I have reviewed the patient's chart and labs.  Questions were answered to the patient's satisfaction.   ? ? ?Jason Mclaughlin ? ?Patient presents for first-ever colonoscopy.  No change.  Chronic diarrhea.  Microcytic anemia.  Iron studies pending.The risks, benefits, limitations, alternatives and imponderables have been reviewed with the patient. Questions have been answered. All parties are agreeable.   ?

## 2021-10-28 NOTE — Anesthesia Postprocedure Evaluation (Signed)
Anesthesia Post Note ? ?Patient: MAXMILIAN TROSTEL ? ?Procedure(s) Performed: COLONOSCOPY WITH PROPOFOL ?HOT HEMOSTASIS (ARGON PLASMA COAGULATION/BICAP) ?POLYPECTOMY ?BIOPSY ? ?Patient location during evaluation: Phase II ?Anesthesia Type: General ?Level of consciousness: awake and alert and oriented ?Pain management: pain level controlled ?Vital Signs Assessment: post-procedure vital signs reviewed and stable ?Respiratory status: spontaneous breathing, nonlabored ventilation and respiratory function stable ?Cardiovascular status: blood pressure returned to baseline and stable ?Postop Assessment: no apparent nausea or vomiting ?Anesthetic complications: no ? ? ?No notable events documented. ? ? ?Last Vitals:  ?Vitals:  ? 10/28/21 1432 10/28/21 1443  ?BP: (!) 93/57 104/77  ?Pulse:    ?Resp: (!) 26   ?Temp:    ?SpO2: 94%   ?  ?Last Pain:  ?Vitals:  ? 10/28/21 1432  ?TempSrc:   ?PainSc: 0-No pain  ? ? ?  ?  ?  ?  ?  ?  ? ?Rondell Frick C Faithlynn Deeley ? ? ? ? ?

## 2021-10-28 NOTE — Op Note (Signed)
[facility] ?Patient Name: Jason Mclaughlin ?Procedure Date: 10/28/2021 12:46 PM ?MRN: [id number] ?Date of Birth: [birth year] ?Attending MD: Norvel Richards , MD ?CSN: [id number] ?Age: 66 ?Admit Type: Outpatient ?Procedure:                Colonoscopy ?Indications:              Screening for colorectal malignant  ?                          neoplasm/microcytic anemia/chronic diarrhea ?Providers:                Norvel Richards, MD, Lambert Mody,  ?                          Raphael Gibney, Technician ?Referring MD:              ?Medicines:                Propofol per Anesthesia ?Complications:            No immediate complications. ?Estimated Blood Loss:     Estimated blood loss was minimal. ?Procedure:                Pre-Anesthesia Assessment: ?                          - Prior to the procedure, a History and Physical  ?                          was performed, and patient medications and  ?                          allergies were reviewed. The patient's tolerance of  ?                          previous anesthesia was also reviewed. The risks  ?                          and benefits of the procedure and the sedation  ?                          options and risks were discussed with the patient.  ?                          All questions were answered, and informed consent  ?                          was obtained. Prior Anticoagulants: The patient has  ?                          taken no previous anticoagulant or antiplatelet  ?                          agents. ASA Grade Assessment: II - A patient with  ?                          mild systemic  disease. After reviewing the risks  ?                          and benefits, the patient was deemed in  ?                          satisfactory condition to undergo the procedure. ?                          After obtaining informed consent, the colonoscope  ?                          was passed under direct vision. Throughout the  ?                          procedure,  the patient's blood pressure, pulse, and  ?                          oxygen saturations were monitored continuously. The  ?                          [id number]) scope was introduced through the  ?                          anus and advanced to the the cecum, identified by  ?                          appendiceal orifice and ileocecal valve. The  ?                          ileocecal valve, appendiceal orifice, and rectum  ?                          were photographed. ?Scope In: 1:09:58 PM ?Scope Out: 2:23:54 PM ?Scope Withdrawal Time: 1 hour 10 minutes 11 seconds  ?Total Procedure Duration: 1 hour 13 minutes 56 seconds  ?Findings: ?     Rectal: abnormal digital rectal examination. Soft nearly circumferential  ?     mass palpable distal rectum; Colonoscopic findings: Exophytic spiral  ?     nearly circumferential mass beginning at the anal verge extending up  ?     approximately 10 cm. Please see on???face and retroflexed images.  ?     Examination of the colon revealed it was studded with polyps throughout  ?     the sigmoid, splenic flexure and hepatic flexure segments. (50) polyps  ?     ranging from 49m to 1.5 cm sessile and pedunculated (many over 1 cm) hot  ?     and cold snare removed. There were a couple of polyps just proximal to  ?     the rectal lesion which were left alone. The rectal mass was biopsied  ?     multiple times. ?Impression:               -Large rectal mass likely representing at least an  ?  advanced adenoma. 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                          within it. Multiple biopsies taken. ?                          -Numerous colon polyps as described above. Removed  ?                          with hot and cold snare polypectomy ?Moderate Sedation: ?     Moderate (conscious) sedation was personally administered by an  ?     anesthesia professional. The following parameters were monitored: oxygen  ?     saturation, heart rate, blood pressure,  respiratory rate, EKG, adequacy  ?     of pulmonary ventilation, and response to care. ?Recommendation:           - Patient has a contact number available for  ?                          emergencies. The signs and symptoms of potential  ?                          delayed complications were discussed with the  ?                          patient. Return to normal activities tomorrow.  ?                          Written discharge instructions were provided to the  ?                          patient. Anticipate surgical referral in the near  ?                          future. ?                          - Advance diet as tolerated. Follow-up on  ?                          pathology. Further recommendations to follow. ?Procedure Code(s):        --- Professional --- ?                          [phone], Colonoscopy, flexible; diagnostic, including  ?                          collection of specimen(s) by brushing or washing,  ?                          when performed (separate procedure) ?Diagnosis Code(s):        --- Professional --- ?                          Z12.11, Encounter for screening for malignant  ?  neoplasm of colon ?CPT copyright 2019 American Medical Association. All rights reserved. ?The codes documented in this report are preliminary and upon coder review may  ?be revised to meet current compliance requirements. ?Cristopher Estimable. Martine Bleecker, MD ?Norvel Richards, MD ?10/28/2021 2:40:42 PM ?This report has been signed electronically. ?Number of Addenda: 0 ?

## 2021-10-28 NOTE — Discharge Instructions (Addendum)
?  Colonoscopy ?Discharge Instructions ? ?Read the instructions outlined below and refer to this sheet in the next few weeks. These discharge instructions provide you with general information on caring for yourself after you leave the hospital. Your doctor may also give you specific instructions. While your treatment has been planned according to the most current medical practices available, unavoidable complications occasionally occur. If you have any problems or questions after discharge, call Dr. Gala Romney at [phone]. ?ACTIVITY ?You may resume your regular activity, but move at a slower pace for the next 24 hours.  ?Take frequent rest periods for the next 24 hours.  ?Walking will help get rid of the air and reduce the bloated feeling in your belly (abdomen).  ?No driving for 24 hours (because of the medicine (anesthesia) used during the test).   ?Do not sign any important legal documents or operate any machinery for 24 hours (because of the anesthesia used during the test).  ?NUTRITION ?Drink plenty of fluids.  ?You may resume your normal diet as instructed by your doctor.  ?Begin with a light meal and progress to your normal diet. Heavy or fried foods are harder to digest and may make you feel sick to your stomach (nauseated).  ?Avoid alcoholic beverages for 24 hours or as instructed.  ?MEDICATIONS ?You may resume your normal medications unless your doctor tells you otherwise.  ?WHAT YOU CAN EXPECT TODAY ?Some feelings of bloating in the abdomen.  ?Passage of more gas than usual.  ?Spotting of blood in your stool or on the toilet paper.  ?IF YOU HAD POLYPS REMOVED DURING THE COLONOSCOPY: ?No aspirin products for 7 days or as instructed.  ?No alcohol for 7 days or as instructed.  ?Eat a soft diet for the next 24 hours.  ?FINDING OUT THE RESULTS OF YOUR TEST ?Not all test results are available during your visit. If your test results are not back during the visit, make an appointment with your caregiver to find out the  results. Do not assume everything is normal if you have not heard from your caregiver or the medical facility. It is important for you to follow up on all of your test results.  ?SEEK IMMEDIATE MEDICAL ATTENTION IF: ?You have more than a spotting of blood in your stool.  ?Your belly is swollen (abdominal distention).  ?You are nauseated or vomiting.  ?You have a temperature over 101.  ?You have abdominal pain or discomfort that is severe or gets worse throughout the day.   ? ? ? ?Your colon was loaded with polyps I removed 50. ? ?You have a rectal mass which was biopsied.  You may need an operation. ? ?You will likely pass a small amount of blood in your bowel movements over the next 24 hours; it should subside. ? ?I will be in touch with you later in the week when I get the lab report back for review. ? ?At patient request, I called and [place] at [facility] [place] to voicemail; left a detailed message. ?

## 2021-10-28 NOTE — Anesthesia Preprocedure Evaluation (Addendum)
Anesthesia Evaluation  ?Patient identified by MRN, date of birth, ID band ?Patient awake ? ? ? ?Reviewed: ?Allergy & Precautions, NPO status , Patient's Chart, lab work & pertinent test results ? ?Airway ?Mallampati: II ? ?TM Distance: >3 FB ?Neck ROM: Full ? ? ? Dental ? ?(+) Edentulous Upper, Edentulous Lower ?  ?Pulmonary ?Current SmokerPatient did not abstain from smoking.,  ?  ?Pulmonary exam normal ?breath sounds clear to auscultation ? ? ? ? ? ? Cardiovascular ?Exercise Tolerance: Good ?hypertension, Pt. on medications ?Normal cardiovascular exam ?Rhythm:Regular Rate:Normal ? ? ?  ?Neuro/Psych ?negative neurological ROS ? negative psych ROS  ? GI/Hepatic ?Neg liver ROS, Bowel prep,  ?Endo/Other  ?negative endocrine ROS ? Renal/GU ?negative Renal ROS  ?negative genitourinary ?  ?Musculoskeletal ?negative musculoskeletal ROS ?(+)  ? Abdominal ?  ?Peds ?negative pediatric ROS ?(+)  Hematology ?negative hematology ROS ?(+)   ?Anesthesia Other Findings ? ? Reproductive/Obstetrics ?negative OB ROS ? ?  ? ? ? ? ? ? ? ? ? ? ? ? ? ?  ?  ? ? ? ? ? ? ? ?Anesthesia Physical ?Anesthesia Plan ? ?ASA: 2 ? ?Anesthesia Plan: General  ? ?Post-op Pain Management: Minimal or no pain anticipated  ? ?Induction: Intravenous ? ?PONV Risk Score and Plan: TIVA ? ?Airway Management Planned: Nasal Cannula and Natural Airway ? ?Additional Equipment:  ? ?Intra-op Plan:  ? ?Post-operative Plan:  ? ?Informed Consent: I have reviewed the patients History and Physical, chart, labs and discussed the procedure including the risks, benefits and alternatives for the proposed anesthesia with the patient or authorized representative who has indicated his/her understanding and acceptance.  ? ? ? ?Dental advisory given ? ?Plan Discussed with: CRNA and Surgeon ? ?Anesthesia Plan Comments:   ? ? ? ? ? ? ?Anesthesia Quick Evaluation ? ?

## 2021-10-29 LAB — GI PROFILE, STOOL, PCR

## 2021-10-29 LAB — C DIFFICILE, CYTOTOXIN B

## 2021-10-29 LAB — C DIFFICILE TOXINS A+B W/RFLX: C difficile Toxins A+B, EIA: NEGATIVE

## 2021-10-29 LAB — CALPROTECTIN, FECAL

## 2021-10-29 LAB — SPECIMEN STATUS REPORT

## 2021-10-30 LAB — SURGICAL PATHOLOGY

## 2021-10-31 LAB — B12 AND FOLATE PANEL
Folate: 18.3 ng/mL
Vitamin B-12: 175 pg/mL — ABNORMAL LOW (ref 200–1100)

## 2021-10-31 LAB — COMPLETE METABOLIC PANEL WITH GFR
AG Ratio: 1.9 (calc) (ref 1.0–2.5)
ALT: 11 U/L (ref 9–46)
AST: 18 U/L (ref 10–35)
Albumin: 4.4 g/dL (ref 3.6–5.1)
Alkaline phosphatase (APISO): 63 U/L (ref 35–144)
BUN: 12 mg/dL (ref 7–25)
CO2: 25 mmol/L (ref 20–32)
Calcium: 9 mg/dL (ref 8.6–10.3)
Chloride: 105 mmol/L (ref 98–110)
Creat: 0.82 mg/dL (ref 0.70–1.35)
Globulin: 2.3 g/dL (calc) (ref 1.9–3.7)
Glucose, Bld: 85 mg/dL (ref 65–99)
Potassium: 4 mmol/L (ref 3.5–5.3)
Sodium: 140 mmol/L (ref 135–146)
Total Bilirubin: 0.5 mg/dL (ref 0.2–1.2)
Total Protein: 6.7 g/dL (ref 6.1–8.1)
eGFR: 97 mL/min/{1.73_m2} (ref >=60)

## 2021-10-31 LAB — TEST AUTHORIZATION 2

## 2021-10-31 LAB — IRON,TIBC AND FERRITIN PANEL
%SAT: 4 % (calc) — ABNORMAL LOW (ref 20–48)
Ferritin: 4 ng/mL — ABNORMAL LOW (ref 24–380)
Iron: 22 ug/dL — ABNORMAL LOW (ref 50–180)
TIBC: 575 mcg/dL (calc) — ABNORMAL HIGH (ref 250–425)

## 2021-10-31 LAB — CBC
HCT: 38.1 % — ABNORMAL LOW (ref 38.5–50.0)
Hemoglobin: 10.5 g/dL — ABNORMAL LOW (ref 13.2–17.1)
MCH: 19.9 pg — ABNORMAL LOW (ref 27.0–33.0)
MCHC: 27.6 g/dL — ABNORMAL LOW (ref 32.0–36.0)
MCV: 72.2 fL — ABNORMAL LOW (ref 80.0–100.0)
MPV: 9.4 fL (ref 7.5–12.5)
Platelets: 256 10*3/uL (ref 140–400)
RBC: 5.28 10*6/uL (ref 4.20–5.80)
RDW: 16.5 % — ABNORMAL HIGH (ref 11.0–15.0)
WBC: 6.7 10*3/uL (ref 3.8–10.8)

## 2021-10-31 LAB — IGA: Immunoglobulin A: 128 mg/dL (ref 70–320)

## 2021-10-31 LAB — TISSUE TRANSGLUTAMINASE, IGA: (tTG) Ab, IgA: <1 U/mL

## 2021-10-31 LAB — TSH: TSH: 1.85 mIU/L (ref 0.40–4.50)

## 2021-11-04 ENCOUNTER — Telehealth: Payer: Self-pay

## 2021-11-04 ENCOUNTER — Encounter (HOSPITAL_COMMUNITY): Payer: Self-pay | Admitting: Internal Medicine

## 2021-11-04 NOTE — Telephone Encounter (Signed)
Communication noted.  

## 2021-11-04 NOTE — Telephone Encounter (Signed)
Mansouraty, Telford Nab., MD  Daneil Dolin, MD; Sherron Monday, NP; Lodema Hong, CMA ?RR,  ?Welcome back.  Unsure the [facility] is happy to have you back.  ?This patient certainly needs more tissue sampling.  I think that repeat flex sig is reasonable.  However, this patient will benefit most from an ESD attempt if you do not find any evidence of adenocarcinoma on repeat biopsies.  I think that a procedure list who performs ESD may want an EUS but they usually would want to perform it themselves and so I would recommend the following:  ?1) repeat flex sig  ?2) consider cross-sectional imaging and with the findings at least a CT abdomen/pelvis or an MRI pelvis due to the extensiveness of this.  If there is concern on the MRI pelvis then surgical options may be necessary.  ?3) referral to [facility] ([place]) or to [facility] ([place]) or to [facility])  ?4) consider genetics referral and FAP versus attenuated FAP testing  ?Hope this helps.  ?Thanks.  ?GM  ?

## 2021-11-04 NOTE — Telephone Encounter (Signed)
-----   Message from Daneil Dolin, MD sent at 11/02/2021  1:23 PM EDT ----- ?Dear Danella Penton: ? ?Would you please take a look at this pts TCS/ Bx report.  He presented with "diarhea " and IDA - no prior TCS;  he broke my record for # of polyps removed in a single session; all adenomas/TV's wo/ high grade pathology. ? 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  I biopsied this area on-face distally, proximally, and at its base retroflexed along with tips). It extends to the anal verge. ? ?He needs more tissue sampling.  Could he be lucky enough NOT to have carcinoma in this lesion.  I'm willing to bring him back for a FS to embark on an extensive session of debulking/attempt at removing this lesion.  I'm concerned as to what I might run into at the mucosal level (? Need for  ESD/EUS, etc).  I certainly would like to be most efficient with future endos leading up to a decision point regarding need for surgery. ? ?I'd appreciate your opinion on how best to proceed. ? ?Thanks.  Ronalee Belts  ? ?

## 2021-11-05 ENCOUNTER — Other Ambulatory Visit: Payer: Self-pay

## 2021-11-05 DIAGNOSIS — K621 Rectal polyp: Secondary | ICD-10-CM

## 2021-11-22 ENCOUNTER — Ambulatory Visit (HOSPITAL_COMMUNITY)
Admission: RE | Admit: 2021-11-22 | Discharge: 2021-11-22 | Disposition: A | Discharge status: Home / Self Care | Payer: Medicare Other | Source: Ambulatory Visit | Attending: Internal Medicine | Admitting: Internal Medicine

## 2021-11-22 DIAGNOSIS — K621 Rectal polyp: Secondary | ICD-10-CM | POA: Diagnosis present

## 2021-11-27 ENCOUNTER — Other Ambulatory Visit: Payer: Self-pay | Admitting: *Deleted

## 2021-11-27 DIAGNOSIS — K621 Rectal polyp: Secondary | ICD-10-CM

## 2021-12-02 ENCOUNTER — Telehealth: Payer: Self-pay | Admitting: Internal Medicine

## 2021-12-02 NOTE — Telephone Encounter (Signed)
Pt called to say that Dr Gala Romney told him if he didn't hear from Dr Johney Maine by Sunday to call us today to let  him know. Please advise and call [phone] ?

## 2021-12-02 NOTE — Telephone Encounter (Signed)
Pt called to let you know that he has not heard from Dr. Johney Maine' office regarding referral.  ?

## 2021-12-02 NOTE — Telephone Encounter (Signed)
Tammy has it covered.  ?

## 2021-12-03 NOTE — Telephone Encounter (Signed)
Mychart message sent also

## 2021-12-17 ENCOUNTER — Ambulatory Visit: Payer: Self-pay | Admitting: General Surgery

## 2021-12-17 ENCOUNTER — Telehealth: Payer: Self-pay | Admitting: Gastroenterology

## 2021-12-17 DIAGNOSIS — D128 Benign neoplasm of rectum: Secondary | ICD-10-CM

## 2021-12-17 NOTE — Telephone Encounter (Signed)
Received visit note from [place] [facility]. Patient was offered 3 different options of treatment including resection of mass via colonoscopy with possible multiple attempts, surgery to remove the rectum, connect colon to anal canal and place temporary ostomy, or surgery to remove rectum and have permanent colostomy. It appears in Care Everywhere that the patient has chosen the surgical route but unsure if doing temporary or permanent colostomy.   Will continue to follow with patient as needed.   Venetia Night, MSN, FNP-BC, AGACNP-BC [facility]

## 2021-12-17 NOTE — H&P (Signed)
REFERRING PHYSICIAN:  Daneil Dolin, MD   PROVIDER:  Monico Blitz, MD   MRN: [id number] DOB: 11/01/1955 DATE OF ENCOUNTER: 12/11/2021   Subjective    Chief Complaint: New Consultation       History of Present Illness: Jason Mclaughlin is a 66 y.o. male who is seen today as an office consultation at the request of Dr. Gala Romney for evaluation of New Consultation .   Patient underwent a colonoscopy on October 28, 2021 by Dr. Gala Romney due to iron deficiency anemia.  He was noted to have a large rectal mass that began at the anal verge up to approximately 10 cm.  He was noted to have approximately 50 polyps throughout his colon ranging from 5 mm to 1.5 cm, sessile and pedunculated.  It appears that most of these were removed.  MRI was performed.  There is no convincing evidence that the mass invades the muscularis propria.  There are no concerning lymph nodes noted.  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  Patient has a family history significant for colon cancer.  He is a smoker and smokes approximately 1 pack/day.     Review of Systems: A complete review of systems was obtained from the patient.  I have reviewed this information and discussed as appropriate with the patient.  See HPI as well for other ROS.   Medical History: Past Medical History      Past Medical History:  Diagnosis Date   Arthritis     COPD (chronic obstructive pulmonary disease) (CMS-HCC)     Hypertension          There is no problem list on file for this patient.     Past Surgical History  History reviewed. No pertinent surgical history.      Allergies  No Known Allergies           Current Outpatient Medications on File Prior to Visit  Medication Sig Dispense Refill   amLODIPine (NORVASC) 10 MG tablet Take 10 mg by mouth once daily        cyanocobalamin, vitamin B-12, (VITAMIN B12 ORAL) Take by mouth       ferrous sulfate 325 (65 FE) MG EC tablet Take 325 mg by mouth daily with breakfast       loratadine (CLARITIN) 10 mg tablet Take 10 mg by mouth once daily       multivit-minerals/FA/lycopene (ONE-A-DAY MEN'S ORAL) Take by mouth       psyllium, sugar, (METAMUCIL) 3.4 gram packet Take 1 packet by mouth 2 (two) times daily       tadalafiL (CIALIS) 5 MG tablet TAKE 1 TABLET BY MOUTH ONCE DAILY AS NEEDED FOR 10 DAYS        No current facility-administered medications on file prior to visit.      Family History       Family History  Problem Relation Age of Onset   Obesity Father     High blood pressure (Hypertension) Father     Colon cancer Father          Social History        Tobacco Use  Smoking Status Every Day   Types: Cigarettes  Smokeless Tobacco Never      Social History  Social History         Socioeconomic History   Marital status: Single  Tobacco Use   Smoking status: Every Day      Types: Cigarettes   Smokeless tobacco: Never  Substance and Sexual Activity   Alcohol use: Yes        Objective:         Vitals:    12/11/21 0941  BP: (!) 142/84  Pulse: (!) 42  Temp: 36.6 C (97.9 F)  Weight: 72.8 kg (160 lb 6.4 oz)  Height: 175.3 cm ('5\' 9"'$ )      Exam Gen: NAD CV: RRR Lungs: CTA Abd: soft       Labs, Imaging and Diagnostic Testing: MRI reviewed.   Assessment and Plan:  Diagnoses and all orders for this visit:   Rectal polyp   66 year old smoker with polyposis and a large rectal polyp with no definitive signs of carcinoma at this time.  As I see it the patient has several options available (see below).  We have went over these in great detail today.  All questions were answered.  Three options were given to him.    Option #1 go to a GI doctor that can remove the polyp possibly with a colonoscopy.  This may require multiple trips and there is a good chance they will not be  able to get it all removed.   Option #2 surgery to remove the rectum and connect the colon to the anal canal with a temporary ostomy.  This would require you to stop smoking before and after surgery.  This would also require at least 2 surgeries, both would require 2 months off from work.   Option #3 surgery to remove the rectum and permanent colostomy.  This will require about 2 months off from work, but she would not have to stop smoking or have another operation.   Patient decided to proceed with option 3.  We will proceed with robotic assisted APR/very low LAR.  Pt will need preop ostomy marking.       The surgery and anatomy were described to the patient as well as the risks of surgery and the possible complications.  These include: Bleeding, deep abdominal infections and possible wound complications such as hernia and infection, damage to adjacent structures, leak of surgical connections, which can lead to other surgeries and possibly an ostomy, possible need for other procedures, such as abscess drains in radiology, possible prolonged hospital stay, possible diarrhea from removal of part of the colon, possible constipation from narcotics, possible bowel, bladder or sexual dysfunction if having rectal surgery, prolonged fatigue/weakness or appetite loss, possible early recurrence of of disease, possible complications of their medical problems such as heart disease or arrhythmias or lung problems, death (less than 1%). I believe the patient understands and wishes to proceed with the surgery.   Rosario Adie, MD Colon and Rectal Surgery [facility]

## 2022-01-21 ENCOUNTER — Ambulatory Visit: Payer: Managed Care, Other (non HMO) | Admitting: Gastroenterology

## 2022-01-21 ENCOUNTER — Other Ambulatory Visit (HOSPITAL_COMMUNITY): Payer: Self-pay

## 2022-01-21 NOTE — Patient Instructions (Addendum)
DUE TO COVID-19 ONLY TWO VISITORS  (aged 65 and older)  IS ALLOWED TO COME WITH YOU AND STAY IN THE WAITING ROOM ONLY DURING PRE OP AND PROCEDURE.   **NO VISITORS ARE ALLOWED IN THE SHORT STAY AREA OR RECOVERY ROOM!!**  IF YOU WILL BE ADMITTED INTO THE HOSPITAL YOU ARE ALLOWED ONLY FOUR SUPPORT PEOPLE DURING VISITATION HOURS ONLY (7 AM -8PM)   The support person(s) must pass our screening, gel in and out Visitors GUEST BADGE MUST BE WORN VISIBLY  One adult visitor may remain with you overnight and MUST be in the room by 8 P.M.   You are not required to [employer] often Do NOT share personal items Notify your provider if you are in close contact with someone who has COVID or you develop fever 100.4 or greater, new onset of sneezing, cough, sore throat, shortness of breath or body aches.        Your procedure is scheduled on:  02-05-22   Report to [facility] Main Entrance    Report to admitting at 6:15 AM   Call this number if you have problems the morning of surgery [phone]   Follow a clear liquid diet day of prep to prevent dehydration    You may have clear liquids until 5:30 AM DAY OF SURGERY  Clear Liquid Diet Water Black Coffee (sugar ok, NO MILK/CREAM OR CREAMERS)  Tea (sugar ok, NO MILK/CREAM OR CREAMERS) regular and decaf                             Plain Jell-O (NO RED)                                           Fruit ices (not with fruit pulp, NO RED)                                     Popsicles (NO RED)                                                                  Juice: apple, WHITE grape, WHITE cranberry Sports drinks like Gatorade (NO RED) Clear broth(vegetable,chicken,beef)  Drink 2 Pre-Surgery Ensure the evening before surgery (have completed by 10 PM)                   The day of surgery:  Drink ONE (1) Pre-Surgery Clear Ensure at 5:30 AM the morning of surgery. Drink in one sitting. Do not sip.  This drink was given to you  during your hospital  pre-op appointment visit. Nothing else to drink after completing the Pre-Surgery Clear Ensure           If you have questions, please contact your surgeon's office.   FOLLOW BOWEL PREP AND ANY ADDITIONAL PRE OP INSTRUCTIONS YOU RECEIVED FROM YOUR SURGEON'S OFFICE!!!  -Dulcolax, Miralax and Metronidazole  Oral Hygiene is also important to reduce your risk of infection.  Remember - BRUSH YOUR TEETH THE MORNING OF SURGERY WITH YOUR REGULAR TOOTHPASTE   Do NOT smoke after Midnight   Take these medicines the morning of surgery with A SIP OF WATER:  Amlodipine, Claritin.  Okay to use Breztri   DO NOT BRING YOUR HOME MEDICATIONS TO [facility]. PHARMACY WILL DISPENSE MEDICATIONS LISTED ON YOUR MEDICATION LIST TO YOU DURING YOUR ADMISSION [place]!                              You may not have any metal on your body including  jewelry, and body piercing             Do not wear  lotions, powders, cologne, or deodorant              Men may shave face and neck.    Contacts, dentures or bridgework may not be worn into surgery.   Bring small overnight bag day of surgery.  Do not bring valuables to the hospital. [place].  Please read over the following fact sheets you were given: IF YOU HAVE QUESTIONS ABOUT YOUR PRE-OP INSTRUCTIONS PLEASE CALL [facility] - Preparing for Surgery Before surgery, you can play an important role.  Because skin is not sterile, your skin needs to be as free of germs as possible.  You can reduce the number of germs on your skin by washing with CHG (chlorahexidine gluconate) soap before surgery.  CHG is an antiseptic cleaner which kills germs and bonds with the skin to continue killing germs even after washing. Please DO NOT use if you have an allergy to CHG or antibacterial soaps.  If your skin becomes reddened/irritated stop using the CHG and  inform your nurse when you arrive at Short Stay. Do not shave (including legs and underarms) for at least 48 hours prior to the first CHG shower.  You may shave your face/neck.  Please follow these instructions carefully:  1.  Shower with CHG Soap the night before surgery and the  morning of surgery.  2.  If you choose to wash your hair, wash your hair first as usual with your normal  shampoo.  3.  After you shampoo, rinse your hair and body thoroughly to remove the shampoo.                             4.  Use CHG as you would any other liquid soap.  You can apply chg directly to the skin and wash.  Gently with a scrungie or clean washcloth.  5.  Apply the CHG Soap to your body ONLY FROM THE NECK DOWN.   Do   not use on face/ open                           Wound or open sores. Avoid contact with eyes, ears mouth and   genitals (private parts).                       Wash face,  Genitals (private parts) with your normal soap.             6.  Wash thoroughly, paying special attention to the area where your    surgery  will be performed.  7.  Thoroughly rinse your body with warm water from the neck down.  8.  DO NOT shower/wash with your normal soap after using and rinsing off the CHG Soap.                9.  Pat yourself dry with a clean towel.            10.  Wear clean pajamas.            11.  Place clean sheets on your bed the night of your first shower and do not  sleep with pets. Day of Surgery : Do not apply any lotions/deodorants the morning of surgery.  Please wear clean clothes to the hospital/surgery center.  FAILURE TO FOLLOW THESE INSTRUCTIONS MAY RESULT IN THE CANCELLATION OF YOUR SURGERY  PATIENT SIGNATURE_________________________________  NURSE SIGNATURE__________________________________  ________________________________________________________________________    WHAT IS A BLOOD TRANSFUSION? Blood Transfusion Information  A transfusion is the replacement of blood or some of  its parts. Blood is made up of multiple cells which provide different functions. Red blood cells carry oxygen and are used for blood loss replacement. White blood cells fight against infection. Platelets control bleeding. Plasma helps clot blood. Other blood products are available for specialized needs, such as hemophilia or other clotting disorders. BEFORE THE TRANSFUSION  Who gives blood for transfusions?  Healthy volunteers who are fully evaluated to make sure their blood is safe. This is blood bank blood. Transfusion therapy is the safest it has ever been in the practice of medicine. Before blood is taken from a donor, a complete history is taken to make sure that person has no history of diseases nor engages in risky social behavior (examples are intravenous drug use or sexual activity with multiple partners). The donor's travel history is screened to minimize risk of transmitting infections, such as malaria. The donated blood is tested for signs of infectious diseases, such as HIV and hepatitis. The blood is then tested to be sure it is compatible with you in order to minimize the chance of a transfusion reaction. If you or a relative donates blood, this is often done in anticipation of surgery and is not appropriate for emergency situations. It takes many days to process the donated blood. RISKS AND COMPLICATIONS Although transfusion therapy is very safe and saves many lives, the main dangers of transfusion include:  Getting an infectious disease. Developing a transfusion reaction. This is an allergic reaction to something in the blood you were given. Every precaution is taken to prevent this. The decision to have a blood transfusion has been considered carefully by your caregiver before blood is given. Blood is not given unless the benefits outweigh the risks. AFTER THE TRANSFUSION Right after receiving a blood transfusion, you will usually feel much better and more energetic. This is  especially true if your red blood cells have gotten low (anemic). The transfusion raises the level of the red blood cells which carry oxygen, and this usually causes an energy increase. The nurse administering the transfusion will monitor you carefully for complications. HOME CARE INSTRUCTIONS  No special instructions are needed after a transfusion. You may find your energy is better. Speak with your caregiver about any limitations on activity for underlying diseases you may have. SEEK MEDICAL CARE IF:  Your condition is not improving after your transfusion. You develop redness or irritation at the intravenous (IV) site. SEEK IMMEDIATE MEDICAL CARE IF:  Any of the following symptoms occur over the next 12 hours: Shaking  chills. You have a temperature by mouth above 102 F (38.9 C), not controlled by medicine. Chest, back, or muscle pain. People around you feel you are not acting correctly or are confused. Shortness of breath or difficulty breathing. Dizziness and fainting. You get a rash or develop hives. You have a decrease in urine output. Your urine turns a dark color or changes to pink, red, or brown. Any of the following symptoms occur over the next 10 days: You have a temperature by mouth above 102 F (38.9 C), not controlled by medicine. Shortness of breath. Weakness after normal activity. The white part of the eye turns yellow (jaundice). You have a decrease in the amount of urine or are urinating less often. Your urine turns a dark color or changes to pink, red, or brown. Document Released: 07/11/2000 Document Revised: 10/06/2011 Document Reviewed: 02/28/2008 [facility] Patient Information 2014 [place], [place].  _______________________________________________________________________

## 2022-01-21 NOTE — Progress Notes (Addendum)
COVID Vaccine Completed: Yes  Date of COVID positive in last 90 days:  No  PCP - Sharlyne Cai, NP (note on chart) Cardiologist - N/A Pulmonology - Asencion Noble, MD (last OV 2012)  Chest x-ray - 10-10-21 Epic EKG - 01-27-22 Epic Stress Test -  N/A ECHO -  N/A Cardiac Cath -  N/A Pacemaker/ICD device last checked: Spinal Cord Stimulator: N/A  Bowel Prep - Clear liquids day of prep. Dulcolax, Miralax and Metronidazole (Patient has prep and instructions)  Sleep Study -  N/A CPAP -   Fasting Blood Sugar -  N/A Checks Blood Sugar _____ times a day  Blood Thinner Instructions: N/A Aspirin Instructions: Last Dose:  Activity level:   Can go up a flight of stairs and perform activities of daily living without stopping and without symptoms of chest pain or shortness of breath.  Able to exercise without symptoms.  Patient states that he has episodic SOB due to COPD and daily smoking but this has not worsened.  Anesthesia review: COPD, HTN  Patient denies shortness of breath, fever, cough and chest pain at PAT appointment  Patient verbalized understanding of instructions that were given to them at the PAT appointment. Patient was also instructed that they will need to review over the PAT instructions again at home before surgery.

## 2022-01-27 ENCOUNTER — Other Ambulatory Visit: Payer: Self-pay

## 2022-01-27 ENCOUNTER — Encounter (HOSPITAL_COMMUNITY)
Admission: RE | Admit: 2022-01-27 | Discharge: 2022-01-27 | Disposition: A | Discharge status: Home / Self Care | Payer: Medicare Other | Source: Ambulatory Visit | Attending: General Surgery | Admitting: General Surgery

## 2022-01-27 ENCOUNTER — Encounter (HOSPITAL_COMMUNITY): Payer: Self-pay

## 2022-01-27 VITALS — BP 145/95 | HR 87 | Temp 98.2°F | Resp 16 | Ht 69.0 in | Wt 153.4 lb

## 2022-01-27 DIAGNOSIS — I2510 Atherosclerotic heart disease of native coronary artery without angina pectoris: Secondary | ICD-10-CM | POA: Insufficient documentation

## 2022-01-27 DIAGNOSIS — C189 Malignant neoplasm of colon, unspecified: Secondary | ICD-10-CM | POA: Insufficient documentation

## 2022-01-27 DIAGNOSIS — Z01818 Encounter for other preprocedural examination: Secondary | ICD-10-CM | POA: Insufficient documentation

## 2022-01-27 DIAGNOSIS — D128 Benign neoplasm of rectum: Secondary | ICD-10-CM

## 2022-01-27 HISTORY — DX: Unspecified osteoarthritis, unspecified site: M19.90

## 2022-01-27 HISTORY — DX: Pneumonia, unspecified organism: J18.9

## 2022-01-27 HISTORY — DX: Tinnitus, unspecified ear: H93.19

## 2022-01-27 HISTORY — DX: Chronic obstructive pulmonary disease, unspecified: J44.9

## 2022-01-27 LAB — CBC
HCT: 47.1 % (ref 39.0–52.0)
Hemoglobin: 14.3 g/dL (ref 13.0–17.0)
MCH: 24.7 pg — ABNORMAL LOW (ref 26.0–34.0)
MCHC: 30.4 g/dL (ref 30.0–36.0)
MCV: 81.2 fL (ref 80.0–100.0)
Platelets: 244 10*3/uL (ref 150–400)
RBC: 5.8 MIL/uL (ref 4.22–5.81)
RDW: 21.1 % — ABNORMAL HIGH (ref 11.5–15.5)
WBC: 6.2 10*3/uL (ref 4.0–10.5)
nRBC: 0 % (ref 0.0–0.2)

## 2022-01-27 LAB — BASIC METABOLIC PANEL
Anion gap: 8 (ref 5–15)
BUN: 11 mg/dL (ref 8–23)
CO2: 25 mmol/L (ref 22–32)
Calcium: 8.8 mg/dL — ABNORMAL LOW (ref 8.9–10.3)
Chloride: 109 mmol/L (ref 98–111)
Creatinine, Ser: 0.67 mg/dL (ref 0.61–1.24)
GFR, Estimated: >60 mL/min (ref >60)
Glucose, Bld: 87 mg/dL (ref 70–99)
Potassium: 3.7 mmol/L (ref 3.5–5.1)
Sodium: 142 mmol/L (ref 135–145)

## 2022-01-27 NOTE — Consult Note (Signed)
[place] Nurse requested for preoperative stoma site marking  Discussed surgical procedure and stoma creation with patient and family.  Explained role of the [facility] nurse team.  Provided the patient with educational booklet and provided samples of pouching options.  Answered patient and family questions.   Examined patient sitting, and standing in order to place the marking in the patient's visual field, away from any creases or abdominal contour issues and within the rectus muscle.  Attempted to mark below the patient's belt line.   Marked for colostomy in the LLQ  __5__ cm to the left of the umbilicus and at the inferior border of the umbilicus.  Patient's abdomen cleansed with CHG wipes at site markings, allowed to air dry prior to marking.Covered mark with thin film transparent dressing to preserve mark until date of surgery.   [facility] Nurse team will follow up with patient after surgery for continue ostomy care and teaching.   Val Riles, RN, MSN, CWOCN, CNS-BC, pager [phone]

## 2022-01-28 LAB — CEA: CEA: 9.2 ng/mL — ABNORMAL HIGH (ref 0.0–4.7)

## 2022-01-31 ENCOUNTER — Encounter (HOSPITAL_COMMUNITY): Payer: Self-pay | Admitting: Nurse Practitioner

## 2022-01-31 ENCOUNTER — Ambulatory Visit (HOSPITAL_COMMUNITY)
Admission: RE | Admit: 2022-01-31 | Discharge: 2022-01-31 | Disposition: A | Discharge status: Home / Self Care | Payer: Medicare Other | Source: Ambulatory Visit | Attending: General Surgery | Admitting: General Surgery

## 2022-01-31 DIAGNOSIS — Z933 Colostomy status: Secondary | ICD-10-CM | POA: Diagnosis present

## 2022-01-31 DIAGNOSIS — Z433 Encounter for attention to colostomy: Secondary | ICD-10-CM

## 2022-01-31 DIAGNOSIS — F17200 Nicotine dependence, unspecified, uncomplicated: Secondary | ICD-10-CM | POA: Diagnosis not present

## 2022-01-31 NOTE — Discharge Instructions (Signed)
Will see back in clinic 2 weeks after surgery.

## 2022-01-31 NOTE — Progress Notes (Signed)
[facility]   Reason for visit:  New patient.  Has ostomy surgery in 10 days.  Has questions about life with an ostomy, supply acquisition.  Wife present with him.  HPI:  Rectal polyp.  Has opted for removal of rectum, permanent colostomy.  Smoker and drinks 2 beers daily.  ROS  Review of Systems  Respiratory:         Productive cough, long time smoker.  No desire for smoking cessation at this time.   Gastrointestinal:        Pre operative stomal marking in LLQ.    Skin: Negative.   Psychiatric/Behavioral:  The patient is nervous/anxious.        Questions related to upcoming surgery and life with an ostomy.  Still works full time in [employer].  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   All other systems reviewed and are negative.  Vital signs:  BP 130/89 (BP Location: Right Arm)   Pulse 85   Temp (!) 97.4 F (36.3 C) (Oral)   Resp 18   SpO2 98%  Exam:  Physical Exam Vitals reviewed.  Constitutional:      Appearance: Normal appearance.  Pulmonary:     Breath sounds: Rhonchi present.  Abdominal:     Palpations: Abdomen is soft.  Musculoskeletal:        General: Normal range of motion.  Skin:    General: Skin is warm and dry.  Neurological:     Mental Status: He is alert and oriented to person, place, and time.  Psychiatric:        Mood and Affect: Mood normal.        Behavior: Behavior normal.        Thought Content: Thought content normal.        Judgment: Judgment normal.     Education provided:  Demonstrated pouching options, discussed post surgical healing and potential barriers to healing, including smoking.   Wife is present and we discuss twice weekly pouch changes, emptying when 1/3 full and supplies after surgery.     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  Presurgical counseling.  Discussion  See above.   Plan  Email  patient clean copy of pre op instructions provided by [facility].  I will reach out to them for this.     Visit time: 65 minutes.   Domenic Moras FNP-BC

## 2022-02-05 ENCOUNTER — Inpatient Hospital Stay (HOSPITAL_COMMUNITY): Payer: Medicare Other | Admitting: Physician Assistant

## 2022-02-05 ENCOUNTER — Inpatient Hospital Stay (HOSPITAL_COMMUNITY): Payer: Medicare Other | Admitting: Anesthesiology

## 2022-02-05 ENCOUNTER — Encounter (HOSPITAL_COMMUNITY)
Admission: RE | Disposition: A | Discharge status: Home Health | Payer: Self-pay | Source: Home / Self Care | Attending: General Surgery

## 2022-02-05 ENCOUNTER — Other Ambulatory Visit: Payer: Self-pay

## 2022-02-05 ENCOUNTER — Encounter (HOSPITAL_COMMUNITY): Payer: Self-pay | Admitting: General Surgery

## 2022-02-05 ENCOUNTER — Inpatient Hospital Stay (HOSPITAL_COMMUNITY)
Admission: RE | Admit: 2022-02-05 | Discharge: 2022-02-08 | DRG: 331 | Disposition: A | Discharge status: Home Health | Payer: Medicare Other | Attending: General Surgery | Admitting: General Surgery

## 2022-02-05 DIAGNOSIS — K6289 Other specified diseases of anus and rectum: Secondary | ICD-10-CM | POA: Diagnosis present

## 2022-02-05 DIAGNOSIS — F17210 Nicotine dependence, cigarettes, uncomplicated: Secondary | ICD-10-CM | POA: Diagnosis present

## 2022-02-05 DIAGNOSIS — J449 Chronic obstructive pulmonary disease, unspecified: Secondary | ICD-10-CM | POA: Diagnosis present

## 2022-02-05 DIAGNOSIS — K621 Rectal polyp: Secondary | ICD-10-CM | POA: Diagnosis present

## 2022-02-05 DIAGNOSIS — Z79899 Other long term (current) drug therapy: Secondary | ICD-10-CM

## 2022-02-05 DIAGNOSIS — M1990 Unspecified osteoarthritis, unspecified site: Secondary | ICD-10-CM | POA: Diagnosis present

## 2022-02-05 DIAGNOSIS — I10 Essential (primary) hypertension: Secondary | ICD-10-CM

## 2022-02-05 DIAGNOSIS — Z800 Family history of malignant neoplasm of digestive organs: Secondary | ICD-10-CM | POA: Diagnosis not present

## 2022-02-05 DIAGNOSIS — D125 Benign neoplasm of sigmoid colon: Principal | ICD-10-CM | POA: Diagnosis present

## 2022-02-05 HISTORY — PX: XI ROBOTIC ASSISTED LOWER ANTERIOR RESECTION: SHX6558

## 2022-02-05 HISTORY — PX: OSTOMY: SHX5997

## 2022-02-05 LAB — TYPE AND SCREEN
ABO/RH(D): A POS
Antibody Screen: NEGATIVE

## 2022-02-05 LAB — ABO/RH: ABO/RH(D): A POS

## 2022-02-05 SURGERY — RESECTION, RECTUM, LOW ANTERIOR, ROBOT-ASSISTED
Anesthesia: General | Site: Abdomen

## 2022-02-05 MED ORDER — PHENYLEPHRINE HCL (PRESSORS) 10 MG/ML IV SOLN
INTRAVENOUS | Status: DC | PRN
  Administered 2022-02-05: 160 ug via INTRAVENOUS
  Administered 2022-02-05: 80 ug via INTRAVENOUS
  Administered 2022-02-05: 160 ug via INTRAVENOUS

## 2022-02-05 MED ORDER — PHENYLEPHRINE HCL (PRESSORS) 10 MG/ML IV SOLN
INTRAVENOUS | Status: AC
  Filled 2022-02-05: qty 1

## 2022-02-05 MED ORDER — LACTATED RINGERS IR SOLN
Status: DC | PRN
  Administered 2022-02-05: 1000 mL

## 2022-02-05 MED ORDER — ENSURE PRE-SURGERY PO LIQD
296.0000 mL | Freq: Once | ORAL | Status: DC
  Filled 2022-02-05: qty 296

## 2022-02-05 MED ORDER — SACCHAROMYCES BOULARDII 250 MG PO CAPS
250.0000 mg | ORAL_CAPSULE | Freq: Two times a day (BID) | ORAL | Status: DC
  Administered 2022-02-05 – 2022-02-08 (×7): 250 mg via ORAL
  Filled 2022-02-05 (×7): qty 1

## 2022-02-05 MED ORDER — LIDOCAINE HCL (PF) 2 % IJ SOLN
INTRAMUSCULAR | Status: DC | PRN
  Administered 2022-02-05: 1.5 mg/kg/h

## 2022-02-05 MED ORDER — KCL IN DEXTROSE-NACL 20-5-0.45 MEQ/L-%-% IV SOLN
INTRAVENOUS | Status: DC
  Filled 2022-02-05 (×2): qty 1000

## 2022-02-05 MED ORDER — HYDROMORPHONE HCL 1 MG/ML IJ SOLN
INTRAMUSCULAR | Status: AC
  Filled 2022-02-05: qty 1

## 2022-02-05 MED ORDER — ROCURONIUM BROMIDE 10 MG/ML (PF) SYRINGE
PREFILLED_SYRINGE | INTRAVENOUS | Status: AC
  Filled 2022-02-05: qty 10

## 2022-02-05 MED ORDER — PROPOFOL 10 MG/ML IV BOLUS
INTRAVENOUS | Status: DC | PRN
  Administered 2022-02-05: 200 mg via INTRAVENOUS

## 2022-02-05 MED ORDER — ALBUMIN HUMAN 5 % IV SOLN
INTRAVENOUS | Status: AC
  Filled 2022-02-05: qty 250

## 2022-02-05 MED ORDER — ONDANSETRON HCL 4 MG/2ML IJ SOLN
INTRAMUSCULAR | Status: DC | PRN
  Administered 2022-02-05: 4 mg via INTRAVENOUS

## 2022-02-05 MED ORDER — LORATADINE 10 MG PO TABS
10.0000 mg | ORAL_TABLET | Freq: Every day | ORAL | Status: DC
Start: 2022-02-05 — End: 2022-02-08
  Administered 2022-02-05 – 2022-02-08 (×4): 10 mg via ORAL
  Filled 2022-02-05 (×4): qty 1

## 2022-02-05 MED ORDER — CHLORHEXIDINE GLUCONATE 0.12 % MT SOLN
15.0000 mL | Freq: Once | OROMUCOSAL | Status: AC
  Administered 2022-02-05: 15 mL via OROMUCOSAL

## 2022-02-05 MED ORDER — PHENYLEPHRINE HCL-NACL 20-0.9 MG/250ML-% IV SOLN
INTRAVENOUS | Status: DC | PRN
  Administered 2022-02-05: 50 ug/min via INTRAVENOUS

## 2022-02-05 MED ORDER — SUGAMMADEX SODIUM 500 MG/5ML IV SOLN
INTRAVENOUS | Status: DC | PRN
  Administered 2022-02-05: 400 mg via INTRAVENOUS

## 2022-02-05 MED ORDER — BISACODYL 5 MG PO TBEC: 20.0000 mg | DELAYED_RELEASE_TABLET | Freq: Once | ORAL | Status: DC

## 2022-02-05 MED ORDER — ENOXAPARIN SODIUM 40 MG/0.4ML IJ SOSY
40.0000 mg | PREFILLED_SYRINGE | INTRAMUSCULAR | Status: DC
  Administered 2022-02-06 – 2022-02-08 (×3): 40 mg via SUBCUTANEOUS
  Filled 2022-02-05 (×3): qty 0.4

## 2022-02-05 MED ORDER — BUPIVACAINE LIPOSOME 1.3 % IJ SUSP: 20.0000 mL | Freq: Once | INTRAMUSCULAR | Status: DC

## 2022-02-05 MED ORDER — HEPARIN SODIUM (PORCINE) 5000 UNIT/ML IJ SOLN
5000.0000 [IU] | Freq: Once | INTRAMUSCULAR | Status: AC
  Administered 2022-02-05: 5000 [IU] via SUBCUTANEOUS
  Filled 2022-02-05: qty 1

## 2022-02-05 MED ORDER — BUDESON-GLYCOPYRROL-FORMOTEROL 160-9-4.8 MCG/ACT IN AERO: 2.0000 | INHALATION_SPRAY | Freq: Two times a day (BID) | RESPIRATORY_TRACT | Status: DC

## 2022-02-05 MED ORDER — ONDANSETRON HCL 4 MG/2ML IJ SOLN
INTRAMUSCULAR | Status: AC
  Filled 2022-02-05: qty 2

## 2022-02-05 MED ORDER — HYDROMORPHONE HCL 1 MG/ML IJ SOLN: 0.5000 mg | INTRAMUSCULAR | Status: DC | PRN

## 2022-02-05 MED ORDER — FENTANYL CITRATE (PF) 100 MCG/2ML IJ SOLN
INTRAMUSCULAR | Status: DC | PRN
Start: 2022-02-05 — End: 2022-02-05
  Administered 2022-02-05: 100 ug via INTRAVENOUS
  Administered 2022-02-05 (×3): 50 ug via INTRAVENOUS

## 2022-02-05 MED ORDER — SIMETHICONE 80 MG PO CHEW: 40.0000 mg | CHEWABLE_TABLET | Freq: Four times a day (QID) | ORAL | Status: DC | PRN

## 2022-02-05 MED ORDER — PHENYLEPHRINE 80 MCG/ML (10ML) SYRINGE FOR IV PUSH (FOR BLOOD PRESSURE SUPPORT)
PREFILLED_SYRINGE | INTRAVENOUS | Status: AC
Start: 2022-02-05 — End: ?
  Filled 2022-02-05: qty 10

## 2022-02-05 MED ORDER — HYDROMORPHONE HCL 1 MG/ML IJ SOLN
0.2500 mg | INTRAMUSCULAR | Status: DC | PRN
  Administered 2022-02-05: 0.5 mg via INTRAVENOUS

## 2022-02-05 MED ORDER — GABAPENTIN 300 MG PO CAPS
300.0000 mg | ORAL_CAPSULE | ORAL | Status: AC
  Administered 2022-02-05: 300 mg via ORAL
  Filled 2022-02-05: qty 1

## 2022-02-05 MED ORDER — GABAPENTIN 300 MG PO CAPS
300.0000 mg | ORAL_CAPSULE | Freq: Two times a day (BID) | ORAL | Status: DC
  Administered 2022-02-05 – 2022-02-08 (×6): 300 mg via ORAL
  Filled 2022-02-05 (×6): qty 1

## 2022-02-05 MED ORDER — EPHEDRINE 5 MG/ML INJ
INTRAVENOUS | Status: AC
  Filled 2022-02-05: qty 5

## 2022-02-05 MED ORDER — ALVIMOPAN 12 MG PO CAPS
12.0000 mg | ORAL_CAPSULE | ORAL | Status: AC
  Administered 2022-02-05: 12 mg via ORAL
  Filled 2022-02-05: qty 1

## 2022-02-05 MED ORDER — BUPIVACAINE LIPOSOME 1.3 % IJ SUSP
INTRAMUSCULAR | Status: DC | PRN
  Administered 2022-02-05: 20 mL

## 2022-02-05 MED ORDER — ALUM & MAG HYDROXIDE-SIMETH 200-200-20 MG/5ML PO SUSP: 30.0000 mL | Freq: Four times a day (QID) | ORAL | Status: DC | PRN

## 2022-02-05 MED ORDER — FENTANYL CITRATE (PF) 250 MCG/5ML IJ SOLN
INTRAMUSCULAR | Status: AC
  Filled 2022-02-05: qty 5

## 2022-02-05 MED ORDER — KETOROLAC TROMETHAMINE 30 MG/ML IJ SOLN
30.0000 mg | Freq: Once | INTRAMUSCULAR | Status: AC | PRN
  Administered 2022-02-05: 30 mg via INTRAVENOUS

## 2022-02-05 MED ORDER — OXYCODONE HCL 5 MG PO TABS: 5.0000 mg | ORAL_TABLET | Freq: Once | ORAL | Status: DC | PRN

## 2022-02-05 MED ORDER — AMLODIPINE BESYLATE 10 MG PO TABS
10.0000 mg | ORAL_TABLET | Freq: Every day | ORAL | Status: DC
  Administered 2022-02-06 – 2022-02-08 (×2): 10 mg via ORAL
  Filled 2022-02-05 (×3): qty 1

## 2022-02-05 MED ORDER — EPHEDRINE SULFATE (PRESSORS) 50 MG/ML IJ SOLN
INTRAMUSCULAR | Status: DC | PRN
  Administered 2022-02-05 (×2): 10 mg via INTRAVENOUS

## 2022-02-05 MED ORDER — ROCURONIUM BROMIDE 100 MG/10ML IV SOLN
INTRAVENOUS | Status: DC | PRN
  Administered 2022-02-05: 70 mg via INTRAVENOUS
  Administered 2022-02-05: 30 mg via INTRAVENOUS

## 2022-02-05 MED ORDER — ORAL CARE MOUTH RINSE: 15.0000 mL | Freq: Once | OROMUCOSAL | Status: AC

## 2022-02-05 MED ORDER — ACETAMINOPHEN 500 MG PO TABS
1000.0000 mg | ORAL_TABLET | ORAL | Status: AC
  Administered 2022-02-05: 1000 mg via ORAL
  Filled 2022-02-05: qty 2

## 2022-02-05 MED ORDER — ENSURE PRE-SURGERY PO LIQD
592.0000 mL | Freq: Once | ORAL | Status: DC
  Filled 2022-02-05: qty 592

## 2022-02-05 MED ORDER — DEXAMETHASONE SODIUM PHOSPHATE 10 MG/ML IJ SOLN
INTRAMUSCULAR | Status: DC | PRN
  Administered 2022-02-05: 10 mg via INTRAVENOUS

## 2022-02-05 MED ORDER — ENSURE SURGERY PO LIQD
237.0000 mL | Freq: Two times a day (BID) | ORAL | Status: DC
  Administered 2022-02-06 – 2022-02-07 (×4): 237 mL via ORAL

## 2022-02-05 MED ORDER — ONDANSETRON HCL 4 MG PO TABS: 4.0000 mg | ORAL_TABLET | Freq: Four times a day (QID) | ORAL | Status: DC | PRN

## 2022-02-05 MED ORDER — 0.9 % SODIUM CHLORIDE (POUR BTL) OPTIME
TOPICAL | Status: DC | PRN
  Administered 2022-02-05: 2000 mL

## 2022-02-05 MED ORDER — MIDAZOLAM HCL 5 MG/5ML IJ SOLN
INTRAMUSCULAR | Status: DC | PRN
  Administered 2022-02-05: 2 mg via INTRAVENOUS

## 2022-02-05 MED ORDER — TRAMADOL HCL 50 MG PO TABS
50.0000 mg | ORAL_TABLET | Freq: Four times a day (QID) | ORAL | Status: DC | PRN
  Administered 2022-02-06 (×2): 50 mg via ORAL
  Administered 2022-02-07 – 2022-02-08 (×5): 100 mg via ORAL
  Filled 2022-02-05 (×5): qty 2
  Filled 2022-02-05 (×2): qty 1

## 2022-02-05 MED ORDER — ALBUMIN HUMAN 5 % IV SOLN: INTRAVENOUS | Status: DC | PRN

## 2022-02-05 MED ORDER — ONDANSETRON HCL 4 MG/2ML IJ SOLN: 4.0000 mg | Freq: Four times a day (QID) | INTRAMUSCULAR | Status: DC | PRN

## 2022-02-05 MED ORDER — POLYETHYLENE GLYCOL 3350 17 GM/SCOOP PO POWD: 1.0000 | Freq: Once | ORAL | Status: DC

## 2022-02-05 MED ORDER — BUPIVACAINE LIPOSOME 1.3 % IJ SUSP
INTRAMUSCULAR | Status: AC
  Filled 2022-02-05: qty 20

## 2022-02-05 MED ORDER — DEXAMETHASONE SODIUM PHOSPHATE 10 MG/ML IJ SOLN
INTRAMUSCULAR | Status: AC
  Filled 2022-02-05: qty 1

## 2022-02-05 MED ORDER — PROPOFOL 10 MG/ML IV BOLUS
INTRAVENOUS | Status: AC
  Filled 2022-02-05: qty 20

## 2022-02-05 MED ORDER — KETOROLAC TROMETHAMINE 30 MG/ML IJ SOLN
INTRAMUSCULAR | Status: AC
  Filled 2022-02-05: qty 1

## 2022-02-05 MED ORDER — LACTATED RINGERS IV SOLN: INTRAVENOUS | Status: DC

## 2022-02-05 MED ORDER — LIDOCAINE HCL (CARDIAC) PF 100 MG/5ML IV SOSY
PREFILLED_SYRINGE | INTRAVENOUS | Status: DC | PRN
  Administered 2022-02-05: 100 mg via INTRAVENOUS

## 2022-02-05 MED ORDER — BUPIVACAINE-EPINEPHRINE (PF) 0.25% -1:200000 IJ SOLN
INTRAMUSCULAR | Status: AC
  Filled 2022-02-05: qty 30

## 2022-02-05 MED ORDER — LIDOCAINE HCL (PF) 2 % IJ SOLN
INTRAMUSCULAR | Status: AC
  Filled 2022-02-05: qty 25

## 2022-02-05 MED ORDER — ALVIMOPAN 12 MG PO CAPS
12.0000 mg | ORAL_CAPSULE | Freq: Two times a day (BID) | ORAL | Status: DC
  Administered 2022-02-06 – 2022-02-07 (×4): 12 mg via ORAL
  Filled 2022-02-05 (×5): qty 1

## 2022-02-05 MED ORDER — SODIUM CHLORIDE 0.9 % IV SOLN
2.0000 g | INTRAVENOUS | Status: AC
  Administered 2022-02-05: 2 g via INTRAVENOUS
  Filled 2022-02-05: qty 2

## 2022-02-05 MED ORDER — UMECLIDINIUM BROMIDE 62.5 MCG/ACT IN AEPB
1.0000 | INHALATION_SPRAY | Freq: Every day | RESPIRATORY_TRACT | Status: DC
Start: 2022-02-05 — End: 2022-02-08
  Administered 2022-02-05 – 2022-02-08 (×4): 1 via RESPIRATORY_TRACT
  Filled 2022-02-05: qty 7

## 2022-02-05 MED ORDER — MOMETASONE FURO-FORMOTEROL FUM 200-5 MCG/ACT IN AERO
2.0000 | INHALATION_SPRAY | Freq: Two times a day (BID) | RESPIRATORY_TRACT | Status: DC
  Administered 2022-02-05 – 2022-02-08 (×6): 2 via RESPIRATORY_TRACT
  Filled 2022-02-05: qty 8.8

## 2022-02-05 MED ORDER — ACETAMINOPHEN 500 MG PO TABS
1000.0000 mg | ORAL_TABLET | Freq: Four times a day (QID) | ORAL | Status: DC
  Administered 2022-02-05 – 2022-02-08 (×11): 1000 mg via ORAL
  Filled 2022-02-05 (×11): qty 2

## 2022-02-05 MED ORDER — ONDANSETRON HCL 4 MG/2ML IJ SOLN
4.0000 mg | Freq: Once | INTRAMUSCULAR | Status: DC | PRN
Start: 2022-02-05 — End: 2022-02-05

## 2022-02-05 MED ORDER — SODIUM CHLORIDE 0.9 % IV SOLN
2.0000 g | Freq: Two times a day (BID) | INTRAVENOUS | Status: AC
  Administered 2022-02-05: 2 g via INTRAVENOUS
  Filled 2022-02-05: qty 2

## 2022-02-05 MED ORDER — BUPIVACAINE-EPINEPHRINE 0.25% -1:200000 IJ SOLN
INTRAMUSCULAR | Status: DC | PRN
  Administered 2022-02-05: 30 mL

## 2022-02-05 MED ORDER — MIDAZOLAM HCL 2 MG/2ML IJ SOLN
INTRAMUSCULAR | Status: AC
  Filled 2022-02-05: qty 2

## 2022-02-05 MED ORDER — OXYCODONE HCL 5 MG/5ML PO SOLN: 5.0000 mg | Freq: Once | ORAL | Status: DC | PRN

## 2022-02-05 SURGICAL SUPPLY — 108 items
BAG COUNTER SPONGE SURGICOUNT (BAG) ×3 IMPLANT
BLADE EXTENDED COATED 6.5IN (ELECTRODE) IMPLANT
CANNULA REDUC XI 12-8 STAPL (CANNULA) ×3
CANNULA REDUCER 12-8 DVNC XI (CANNULA) IMPLANT
CELLS DAT CNTRL 66122 CELL SVR (MISCELLANEOUS) IMPLANT
COVER SURGICAL LIGHT HANDLE (MISCELLANEOUS) ×6 IMPLANT
COVER TIP SHEARS 8 DVNC (MISCELLANEOUS) ×2 IMPLANT
COVER TIP SHEARS 8MM DA VINCI (MISCELLANEOUS) ×3
DERMABOND ADVANCED (GAUZE/BANDAGES/DRESSINGS) ×1
DERMABOND ADVANCED .7 DNX12 (GAUZE/BANDAGES/DRESSINGS) IMPLANT
DRAIN CHANNEL 19F RND (DRAIN) ×1 IMPLANT
DRAPE ARM DVNC X/XI (DISPOSABLE) ×8 IMPLANT
DRAPE COLUMN DVNC XI (DISPOSABLE) ×2 IMPLANT
DRAPE DA VINCI XI ARM (DISPOSABLE) ×12
DRAPE DA VINCI XI COLUMN (DISPOSABLE) ×3
DRAPE SURG IRRIG POUCH 19X23 (DRAPES) ×3 IMPLANT
DRSG OPSITE POSTOP 4X10 (GAUZE/BANDAGES/DRESSINGS) IMPLANT
DRSG OPSITE POSTOP 4X6 (GAUZE/BANDAGES/DRESSINGS) ×1 IMPLANT
DRSG OPSITE POSTOP 4X8 (GAUZE/BANDAGES/DRESSINGS) IMPLANT
DRSG PAD ABDOMINAL 8X10 ST (GAUZE/BANDAGES/DRESSINGS) ×1 IMPLANT
ELECT PENCIL ROCKER SW 15FT (MISCELLANEOUS) ×3 IMPLANT
ELECT REM PT RETURN 15FT ADLT (MISCELLANEOUS) ×3 IMPLANT
ENDOLOOP SUT PDS II  0 18 (SUTURE)
ENDOLOOP SUT PDS II 0 18 (SUTURE) IMPLANT
EVACUATOR SILICONE 100CC (DRAIN) IMPLANT
GLOVE BIO SURGEON STRL SZ 6.5 (GLOVE) ×9 IMPLANT
GLOVE BIOGEL PI IND STRL 7.0 (GLOVE) ×4 IMPLANT
GLOVE BIOGEL PI INDICATOR 7.0 (GLOVE) ×2
GLOVE INDICATOR 6.5 STRL GRN (GLOVE) ×3 IMPLANT
GOWN SRG XL LVL 4 BRTHBL STRL (GOWNS) ×2 IMPLANT
GOWN STRL NON-REIN XL LVL4 (GOWNS) ×3
GOWN STRL REUS W/ TWL XL LVL3 (GOWN DISPOSABLE) ×6 IMPLANT
GOWN STRL REUS W/TWL XL LVL3 (GOWN DISPOSABLE) ×9
GRASPER SUT TROCAR 14GX15 (MISCELLANEOUS) IMPLANT
HOLDER FOLEY CATH W/STRAP (MISCELLANEOUS) ×3 IMPLANT
IRRIG SUCT STRYKERFLOW 2 WTIP (MISCELLANEOUS) ×3
IRRIGATION SUCT STRKRFLW 2 WTP (MISCELLANEOUS) ×2 IMPLANT
KIT PROCEDURE DA VINCI SI (MISCELLANEOUS)
KIT PROCEDURE DVNC SI (MISCELLANEOUS) IMPLANT
KIT TURNOVER KIT A (KITS) IMPLANT
NDL INSUFFLATION 14GA 120MM (NEEDLE) ×2 IMPLANT
NEEDLE INSUFFLATION 14GA 120MM (NEEDLE) ×3 IMPLANT
PACK CARDIOVASCULAR III (CUSTOM PROCEDURE TRAY) ×3 IMPLANT
PACK COLON (CUSTOM PROCEDURE TRAY) ×3 IMPLANT
PAD POSITIONING PINK XL (MISCELLANEOUS) ×3 IMPLANT
RELOAD STAPLE 45 4.3 GRN DVNC (STAPLE) IMPLANT
RELOAD STAPLE 60 3.5 BLU DVNC (STAPLE) IMPLANT
RELOAD STAPLE 60 4.3 GRN DVNC (STAPLE) IMPLANT
RELOAD STAPLER 3.5X60 BLU DVNC (STAPLE) IMPLANT
RELOAD STAPLER 4.3X45 GRN DVNC (STAPLE) ×6 IMPLANT
RELOAD STAPLER 4.3X60 GRN DVNC (STAPLE) IMPLANT
RETRACTOR WND ALEXIS 18 MED (MISCELLANEOUS) IMPLANT
RTRCTR WOUND ALEXIS 18CM MED (MISCELLANEOUS)
SCISSORS LAP 5X35 DISP (ENDOMECHANICALS) ×1 IMPLANT
SEAL CANN UNIV 5-8 DVNC XI (MISCELLANEOUS) ×6 IMPLANT
SEAL XI 5MM-8MM UNIVERSAL (MISCELLANEOUS) ×12
SEALER VESSEL DA VINCI XI (MISCELLANEOUS) ×3
SEALER VESSEL EXT DVNC XI (MISCELLANEOUS) ×2 IMPLANT
SOLUTION ELECTROLUBE (MISCELLANEOUS) ×3 IMPLANT
SPIKE FLUID TRANSFER (MISCELLANEOUS) IMPLANT
SPONGE DRAIN TRACH 4X4 STRL 2S (GAUZE/BANDAGES/DRESSINGS) ×1 IMPLANT
STAPLER 45 DA VINCI SURE FORM (STAPLE) ×3
STAPLER 45 SUREFORM DVNC (STAPLE) IMPLANT
STAPLER 60 DA VINCI SURE FORM (STAPLE)
STAPLER 60 SUREFORM DVNC (STAPLE) IMPLANT
STAPLER CANNULA SEAL DVNC XI (STAPLE) IMPLANT
STAPLER CANNULA SEAL XI (STAPLE) ×3
STAPLER ECHELON POWER CIR 29 (STAPLE) IMPLANT
STAPLER ECHELON POWER CIR 31 (STAPLE) IMPLANT
STAPLER RELOAD 3.5X60 BLU DVNC (STAPLE)
STAPLER RELOAD 3.5X60 BLUE (STAPLE)
STAPLER RELOAD 4.3X45 GREEN (STAPLE) ×9
STAPLER RELOAD 4.3X45 GRN DVNC (STAPLE) ×6
STAPLER RELOAD 4.3X60 GREEN (STAPLE)
STAPLER RELOAD 4.3X60 GRN DVNC (STAPLE)
STOPCOCK 4 WAY LG BORE MALE ST (IV SETS) ×6 IMPLANT
SUT ETHILON 2 0 PS N (SUTURE) IMPLANT
SUT ETHILON 3 0 PS 1 (SUTURE) ×1 IMPLANT
SUT NOVA NAB DX-16 0-1 5-0 T12 (SUTURE) ×6 IMPLANT
SUT PROLENE 2 0 KS (SUTURE) ×1 IMPLANT
SUT SILK 2 0 (SUTURE) ×3
SUT SILK 2 0 SH CR/8 (SUTURE) IMPLANT
SUT SILK 2-0 18XBRD TIE 12 (SUTURE) ×2 IMPLANT
SUT SILK 3 0 (SUTURE)
SUT SILK 3 0 SH CR/8 (SUTURE) ×3 IMPLANT
SUT SILK 3-0 18XBRD TIE 12 (SUTURE) IMPLANT
SUT V-LOC BARB 180 2/0GR6 GS22 (SUTURE)
SUT V-LOC BARB 180 2/0GR9 GS23 (SUTURE) ×3
SUT VIC AB 2-0 SH 18 (SUTURE) ×2 IMPLANT
SUT VIC AB 2-0 SH 27 (SUTURE)
SUT VIC AB 2-0 SH 27X BRD (SUTURE) IMPLANT
SUT VIC AB 3-0 SH 18 (SUTURE) IMPLANT
SUT VIC AB 4-0 PS2 27 (SUTURE) ×6 IMPLANT
SUT VICRYL 0 UR6 27IN ABS (SUTURE) ×3 IMPLANT
SUT VLOC 180 2-0 6IN GS21 (SUTURE) ×2 IMPLANT
SUTURE V-LC BRB 180 2/0GR6GS22 (SUTURE) IMPLANT
SUTURE V-LC BRB 180 2/0GR9GS23 (SUTURE) IMPLANT
SYR 10ML ECCENTRIC (SYRINGE) ×3 IMPLANT
SYS LAPSCP GELPORT 120MM (MISCELLANEOUS)
SYS WOUND ALEXIS 18CM MED (MISCELLANEOUS)
SYSTEM LAPSCP GELPORT 120MM (MISCELLANEOUS) IMPLANT
SYSTEM WOUND ALEXIS 18CM MED (MISCELLANEOUS) IMPLANT
TOWEL OR 17X26 10 PK STRL BLUE (TOWEL DISPOSABLE) IMPLANT
TOWEL OR NON WOVEN STRL DISP B (DISPOSABLE) ×3 IMPLANT
TRAY FOLEY MTR SLVR 16FR STAT (SET/KITS/TRAYS/PACK) ×3 IMPLANT
TROCAR ADV FIXATION 5X100MM (TROCAR) ×3 IMPLANT
TUBING CONNECTING 10 (TUBING) ×6 IMPLANT
TUBING INSUFFLATION 10FT LAP (TUBING) ×3 IMPLANT

## 2022-02-05 NOTE — Anesthesia Procedure Notes (Signed)
Procedure Name: Intubation Date/Time: 02/05/2022 8:43 AM  Performed by: Jonna Munro, CRNAPre-anesthesia Checklist: Patient identified, Emergency Drugs available, Suction available, Patient being monitored and Timeout performed Patient Re-evaluated:Patient Re-evaluated prior to induction Oxygen Delivery Method: Circle system utilized Preoxygenation: Pre-oxygenation with 100% oxygen Induction Type: IV induction Ventilation: Mask ventilation without difficulty Laryngoscope Size: Mac and 3 Grade View: Grade I Tube type: Oral Tube size: 7.5 mm Number of attempts: 1 Airway Equipment and Method: Stylet Placement Confirmation: ETT inserted through vocal cords under direct vision, positive ETCO2, CO2 detector and breath sounds checked- equal and bilateral Secured at: 22 cm Tube secured with: Tape Dental Injury: Teeth and Oropharynx as per pre-operative assessment

## 2022-02-05 NOTE — Progress Notes (Signed)
Mild to moderate bleeding noted from the rectum. Applied pressure dressing and icepack. Notified MD on call.  Pt's vitals are within normal limits. Will monitor.

## 2022-02-05 NOTE — H&P (Signed)
REFERRING PHYSICIAN:  Daneil Dolin, MD   PROVIDER:  Monico Blitz, MD   MRN: [id number] DOB: [birth year] DATE OF ENCOUNTER: 12/11/2021   Subjective    Chief Complaint: New Consultation       History of Present Illness: Jason Mclaughlin is a 66 y.o. male who is seen today as an office consultation at the request of Dr. Gala Romney for evaluation of New Consultation .   Patient underwent a colonoscopy on October 28, 2021 by Dr. Gala Romney due to iron deficiency anemia.  He was noted to have a large rectal mass that began at the anal verge up to approximately 10 cm.  He was noted to have approximately 50 polyps throughout his colon ranging from 5 mm to 1.5 cm, sessile and pedunculated.  It appears that most of these were removed.  MRI was performed.  There is no convincing evidence that the mass invades the muscularis propria.  There are no concerning lymph nodes noted.  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  Patient has a family history significant for colon cancer.  He is a smoker and smokes approximately 1 pack/day.     Review of Systems: A complete review of systems was obtained from the patient.  I have reviewed this information and discussed as appropriate with the patient.  See HPI as well for other ROS.   Medical History: Past Medical History         Past Medical History:  Diagnosis Date   Arthritis     COPD (chronic obstructive pulmonary disease) (CMS-HCC)     Hypertension          There is no problem list on file for this patient.     Past Surgical History  History reviewed. No pertinent surgical history.      Allergies  No Known Allergies                 Current Outpatient Medications on File Prior to Visit  Medication Sig Dispense Refill   amLODIPine (NORVASC) 10 MG tablet Take 10 mg by mouth once daily        cyanocobalamin, vitamin B-12, (VITAMIN B12 ORAL) Take by mouth       ferrous sulfate 325 (65 FE) MG EC tablet Take 325 mg by mouth daily with breakfast       loratadine (CLARITIN) 10 mg tablet Take 10 mg by mouth once daily       multivit-minerals/FA/lycopene (ONE-A-DAY MEN'S ORAL) Take by mouth       psyllium, sugar, (METAMUCIL) 3.4 gram packet Take 1 packet by mouth 2 (two) times daily       tadalafiL (CIALIS) 5 MG tablet TAKE 1 TABLET BY MOUTH ONCE DAILY AS NEEDED FOR 10 DAYS        No current facility-administered medications on file prior to visit.      Family History           Family History  Problem Relation Age of Onset   Obesity Father     High blood pressure (Hypertension) Father     Colon cancer Father          Social History           Tobacco Use  Smoking Status Every Day   Types: Cigarettes  Smokeless Tobacco Never      Social History  Social History  Socioeconomic History   Marital status: Single  Tobacco Use   Smoking status: Every Day      Types: Cigarettes   Smokeless tobacco: Never  Substance and Sexual Activity   Alcohol use: Yes        Objective:           Vitals:   Vitals:   02/05/22 0634  BP: (!) 136/97  Pulse: 81  Resp: 15  Temp: 97.8 F (36.6 C)  SpO2: 99%      Exam Gen: NAD CV: RRR Lungs: CTA Abd: soft       Labs, Imaging and Diagnostic Testing: MRI reviewed.   Assessment and Plan:  Diagnoses and all orders for this visit:   Rectal polyp   66 year old smoker with polyposis and a large rectal polyp with no definitive signs of carcinoma at this time.  As I see it the patient has several options available (see below).  We have went over these in great detail today.  All questions were answered.  Three options were given to him.    Option #1 go to a GI doctor that can remove the polyp possibly with a colonoscopy.  This may require multiple trips and there is a good chance they will not be able to get  it all removed.   Option #2 surgery to remove the rectum and connect the colon to the anal canal with a temporary ostomy.  This would require you to stop smoking before and after surgery.  This would also require at least 2 surgeries, both would require 2 months off from work.   Option #3 surgery to remove the rectum and permanent colostomy.  This will require about 2 months off from work, but would not have to stop smoking or have another operation.   Patient decided to proceed with option 3.  We will proceed with robotic assisted APR/very low LAR with permanent colostomy.  Pt will need preop ostomy marking.       The surgery and anatomy were described to the patient as well as the risks of surgery and the possible complications.  These include: Bleeding, deep abdominal infections and possible wound complications such as hernia and infection, damage to adjacent structures, leak of surgical connections, which can lead to other surgeries, possible need for other procedures, such as abscess drains in radiology, possible prolonged hospital stay, possible diarrhea from removal of part of the colon, possible constipation from narcotics, possible bowel, bladder or sexual dysfunction if having rectal surgery, prolonged fatigue/weakness or appetite loss, possible early recurrence of of disease, possible complications of their medical problems such as heart disease or arrhythmias or lung problems, death (less than 1%). I believe the patient understands and wishes to proceed with the surgery.   Rosario Adie, MD Colon and Rectal Surgery [facility]

## 2022-02-05 NOTE — Anesthesia Preprocedure Evaluation (Signed)
Anesthesia Evaluation  Patient identified by MRN, date of birth, ID band Patient awake    Reviewed: Allergy & Precautions, NPO status , Patient's Chart, lab work & pertinent test results  Airway Mallampati: II  TM Distance: >3 FB Neck ROM: Full    Dental no notable dental hx.    Pulmonary COPD, Current Smoker and Patient abstained from smoking.,    breath sounds clear to auscultation + decreased breath sounds      Cardiovascular hypertension, Pt. on medications Normal cardiovascular exam Rhythm:Regular Rate:Normal     Neuro/Psych negative neurological ROS  negative psych ROS   GI/Hepatic negative GI ROS, Neg liver ROS,   Endo/Other  negative endocrine ROS  Renal/GU negative Renal ROS  negative genitourinary   Musculoskeletal negative musculoskeletal ROS (+)   Abdominal   Peds negative pediatric ROS (+)  Hematology negative hematology ROS (+)   Anesthesia Other Findings   Reproductive/Obstetrics negative OB ROS                             Anesthesia Physical Anesthesia Plan  ASA: 3  Anesthesia Plan: General   Post-op Pain Management: Tylenol PO (pre-op)*   Induction: Intravenous  PONV Risk Score and Plan: 1 and Ondansetron, Dexamethasone and Treatment may vary due to age or medical condition  Airway Management Planned: Oral ETT  Additional Equipment:   Intra-op Plan:   Post-operative Plan: Extubation in OR  Informed Consent: I have reviewed the patients History and Physical, chart, labs and discussed the procedure including the risks, benefits and alternatives for the proposed anesthesia with the patient or authorized representative who has indicated his/her understanding and acceptance.     Dental advisory given  Plan Discussed with: CRNA and Surgeon  Anesthesia Plan Comments:         Anesthesia Quick Evaluation

## 2022-02-05 NOTE — Anesthesia Postprocedure Evaluation (Signed)
Anesthesia Post Note  Patient: Jason Mclaughlin  Procedure(s) Performed: XI ROBOTIC ASSISTED ABDOMINAL PERINEAL RESECTION (Abdomen) OSTOMY CREATION     Patient location during evaluation: PACU Anesthesia Type: General Level of consciousness: awake and alert Pain management: pain level controlled Vital Signs Assessment: post-procedure vital signs reviewed and stable Respiratory status: spontaneous breathing, nonlabored ventilation, respiratory function stable and patient connected to nasal cannula oxygen Cardiovascular status: blood pressure returned to baseline and stable Postop Assessment: no apparent nausea or vomiting Anesthetic complications: no   No notable events documented.  Last Vitals:  Vitals:   02/05/22 1245 02/05/22 1300  BP: 114/79   Pulse: 86 85  Resp: 16 15  Temp:    SpO2: 91% 95%    Last Pain:  Vitals:   02/05/22 1300  TempSrc:   PainSc: 4                  Makinzey Banes S

## 2022-02-05 NOTE — Op Note (Signed)
02/05/2022  11:38 AM  PATIENT:  Jason Mclaughlin  66 y.o. male  Patient Care Team: Adaline Sill, NP as PCP - General (Internal Medicine) Gala Romney, Cristopher Estimable, MD as Consulting Physician (Gastroenterology) Lucia Gaskins, MD (Inactive) (Internal Medicine)  PRE-OPERATIVE DIAGNOSIS:  rectal polyp  POST-OPERATIVE DIAGNOSIS:  rectal polyp  PROCEDURE:  XI ROBOTIC ASSISTED ABDOMINAL PERINEAL RESECTION COLOSTOMY CREATION   Surgeon(s): Leighton Ruff, MD Michael Boston, MD  ASSISTANT: Dr Johney Maine   ANESTHESIA:   local and general  EBL:151m  Total I/O In: 1250 [I.V.:1000; IV Piggyback:250] Out: 260 [Urine:160; Blood:100]  Delay start of Pharmacological VTE agent (>24hrs) due to surgical blood loss or risk of bleeding:  no  DRAINS: (109F) Jackson-Pratt drain(s) with closed bulb suction in the pelvis    SPECIMEN:  Source of Specimen:  sigmoid, rectum and anus  DISPOSITION OF SPECIMEN:  PATHOLOGY  COUNTS:  YES  PLAN OF CARE: Admit to inpatient   PATIENT DISPOSITION:  PACU - hemodynamically stable.  INDICATION:    66y.o. M smoker with   I recommended segmental resection:  The anatomy & physiology of the digestive tract was discussed.  The pathophysiology was discussed.  Natural history risks without surgery was discussed.   I worked to give an overview of the disease and the frequent need to have multispecialty involvement.  I feel the risks of no intervention will lead to serious problems that outweigh the operative risks; therefore, I recommended a partial colectomy to remove the pathology.  Robotic & open techniques were discussed.   Risks such as bleeding, infection, abscess, leak, reoperation, possible ostomy, hernia, heart attack, death, and other risks were discussed.  I noted a good likelihood this will help address the problem.   Goals of post-operative recovery were discussed as well.    The patient expressed understanding & wished to proceed with surgery.  OR  FINDINGS:   Patient had rectal polypoid tissue that extended to his proximal anal canal  No obvious metastatic disease on visceral parietal peritoneum or liver.   DESCRIPTION:   Informed consent was confirmed.  The patient underwent general anaesthesia without difficulty.  The patient was positioned appropriately.  VTE prevention in place.  The patient's abdomen was clipped, prepped, & draped in a sterile fashion.  Surgical timeout confirmed our plan.  The patient was positioned in reverse Trendelenburg.  Abdominal entry was gained using a Varies needle.  Entry was clean.  I induced carbon dioxide insufflation.  An 87mrobotic port was placed in the RUQ.  Camera inspection revealed no injury.  Extra ports were carefully placed under direct laparoscopic visualization.  I laparoscopically reflected the greater omentum and the upper abdomen the small bowel in the upper abdomen. The patient was appropriately positioned and the robot was docked to the patient's left side.  Instruments were placed under direct visualization.    I mobilized the sigmoid colon off of the pelvic sidewall.  I scored the base of peritoneum of the right side of the mesentery of the left colon from the ligament of Treitz to the peritoneal reflection of the mid rectum.  The patient had no evidence of disease externally.  I elevated the sigmoid mesentery and enetered into the retro-mesenteric plane. We were able to identify the left ureter and gonadal vessels. We kept those posterior within the retroperitoneum and elevated the left colon mesentery off that. I did isolated IMA pedicle but did not ligate it yet.  I continued distally and got into the avascular  plane posterior to the mesorectum. This allowed me to help mobilize the rectum as well by freeing the mesorectum off the sacrum.  I mobilized the peritoneal coverings towards the peritoneal reflection on both the right and left sides of the rectum.  I could see the right and left  ureters and stayed away from them.    I skeletonized the inferior mesenteric artery pedicle.  I went down to its takeoff from the aorta.  After confirming the left ureter was out of the way, I went ahead and ligated the inferior mesenteric artery pedicle with bipolar robotic vessel sealer ~2cm above its takeoff from the aorta.  We ensured hemostasis.  I continued my dissection of the mesentery using the robotic vessel sealer to the level of the sigmoid descending colon junction.  I then continued dissecting into the posterior mesorectal plane.  I divided the peritoneal reflection on both sides and continued anteriorly.  I followed my posterior dissection down to the level of the pelvic floor.  I then continued out laterally around the mesorectum.  Lastly, I dissected anteriorly separating the seminal vesicle and prostate from the rectum using blunt dissection and robotic vessel sealer.  I continued down anteriorly to the level of the pelvic floor as well.  I then divided the pelvic floor using the robotic vessel sealer.  I was able to free the anal canal.  I then divided this as distal as possible using 3 green load robotic staplers.  I then closed the pelvic floor using a running 2-0 V-Loc suture.  The peritoneum was then closed using 2, 2-0 V-Loc running sutures.  A 19 French drain was placed into the pelvis and brought out through the right lower quadrant port site.  This was secured into place with a 2-0 nylon suture.  A small Pfannenstiel incision was made.  The Blanchard wound protector was placed.  The anal canal rectum and sigmoid were removed from the abdomen.  Proximal colon was stapled using a 75 mm blue load stapler.  This was then placed back into the abdomen.  The specimen was sent to pathology for further examination.  The remaining colon was brought out through the previously marked ostomy site in the left upper quadrant.    At this point I went down below and evaluated the staple line.  Staples  appear to be in the skin.  I resected these using a Metzenbaum scissor.  The subcutaneous tissue was reapproximated using interrupted 2-0 Vicryl sutures.  The skin was closed at the dermal level with interrupted 2-0 Vicryl sutures.  A sterile dressing was applied.  We then switched to clean gowns, gloves, instruments and drapes.  The Pfannenstiel incision was closed using a running 0 Vicryl suture to close the peritoneum.  The fascia was closed using running #1 Novafil sutures.  The subcutaneous tissue was reapproximated using a running 2-0 Vicryl suture and the skin was closed using a running 4-0 Vicryl suture.  A sterile dressing was applied.  The remaining port sites were closed using 4-0 Vicryl sutures and Dermabond.  Once this was complete the ostomy was matured in standard [place] fashion using interrupted 2-0 Vicryl sutures.  An ostomy appliance was placed.  Patency was confirmed.  Hemostasis was good.  The patient was then awakened from anesthesia and sent to the postanesthesia care unit in stable condition.  All counts were correct per operating room staff.  An MD assistant was necessary for tissue manipulation, retraction and positioning due to the complexity  of the case and hospital policies   Rosario Adie, MD  [facility]

## 2022-02-05 NOTE — Consult Note (Signed)
WOC marked patient in PAT 01/31/22, seen in ostomy clinic as well.  [place], [place], [place]

## 2022-02-05 NOTE — Transfer of Care (Signed)
Immediate Anesthesia Transfer of Care Note  Patient: Jason Mclaughlin  Procedure(s) Performed: XI ROBOTIC ASSISTED ABDOMINAL PERINEAL RESECTION (Abdomen) OSTOMY CREATION  Patient Location: PACU  Anesthesia Type:General  Level of Consciousness: awake, alert , oriented, drowsy and patient cooperative  Airway & Oxygen Therapy: Patient Spontanous Breathing and Patient connected to face mask oxygen  Post-op Assessment: Report given to RN, Post -op Vital signs reviewed and stable and Patient moving all extremities X 4  Post vital signs: Reviewed and stable  Last Vitals:  Vitals Value Taken Time  BP 132/85 02/05/22 1156  Temp    Pulse 77 02/05/22 1159  Resp 17 02/05/22 1159  SpO2 100 % 02/05/22 1159  Vitals shown include unvalidated device data.  Last Pain:  Vitals:   02/05/22 0634  TempSrc: Oral         Complications: No notable events documented.

## 2022-02-06 ENCOUNTER — Encounter (HOSPITAL_COMMUNITY): Payer: Self-pay | Admitting: General Surgery

## 2022-02-06 LAB — CBC
HCT: 38.9 % — ABNORMAL LOW (ref 39.0–52.0)
Hemoglobin: 11.9 g/dL — ABNORMAL LOW (ref 13.0–17.0)
MCH: 25.1 pg — ABNORMAL LOW (ref 26.0–34.0)
MCHC: 30.6 g/dL (ref 30.0–36.0)
MCV: 81.9 fL (ref 80.0–100.0)
Platelets: 213 10*3/uL (ref 150–400)
RBC: 4.75 MIL/uL (ref 4.22–5.81)
RDW: 20.4 % — ABNORMAL HIGH (ref 11.5–15.5)
WBC: 12.1 10*3/uL — ABNORMAL HIGH (ref 4.0–10.5)
nRBC: 0 % (ref 0.0–0.2)

## 2022-02-06 LAB — BASIC METABOLIC PANEL WITH GFR
Anion gap: 9 (ref 5–15)
BUN: 7 mg/dL — ABNORMAL LOW (ref 8–23)
CO2: 23 mmol/L (ref 22–32)
Calcium: 8.5 mg/dL — ABNORMAL LOW (ref 8.9–10.3)
Chloride: 107 mmol/L (ref 98–111)
Creatinine, Ser: 0.75 mg/dL (ref 0.61–1.24)
GFR, Estimated: >60 mL/min
Glucose, Bld: 138 mg/dL — ABNORMAL HIGH (ref 70–99)
Potassium: 4.2 mmol/L (ref 3.5–5.1)
Sodium: 139 mmol/L (ref 135–145)

## 2022-02-06 MED ORDER — CHLORHEXIDINE GLUCONATE CLOTH 2 % EX PADS
6.0000 | MEDICATED_PAD | Freq: Every day | CUTANEOUS | Status: DC
  Administered 2022-02-06: 6 via TOPICAL

## 2022-02-06 NOTE — Consult Note (Signed)
[facility] Nurse ostomy consult note Stoma type/location: LLQ, end colostomy, permanent Stomal assessment/size: 2" budded, pink, moist Peristomal assessment: bruising from 12-5 oclock with one area of skin disruption, etiology appears to be medical adhesive skin injury Treatment options for stomal/peristomal skin: treated area of MARSI by crusting with ostomy powder, using 2" skin barrier ring as well Output scant, brown liquid Ostomy pouching: 2pc. 2 3/4" with 2" skin barrier ring Education provided:  Explained role of ostomy nurse and creation of stoma  Explained stoma characteristics (budded, flush, color, texture, care) Demonstrated pouch change (cutting new skin barrier, measuring stoma, cleaning peristomal skin and stoma, use of barrier ring) Education on emptying when 1/3 to 1/2 full and how to empty Demonstrated "burping" flatus from pouch Demonstrated use of wick to clean spout  Discussed bathing, diet, gas, medication use, constipation Discussed risk of peristomal hernia Discussed treatment of peristomal skin related to [facility] and cause of skin irritation  Provided patient with [employer] and marked items currently using Answered patient/family questions:  patient is WELL informed and can verbalize all steps to the [facility] nurse today Discussed colostomy irrigation and long term use of this for permanent ostomy, will notify ostomy clinic NP of his desire. Requested [place] [facility] to send information on ostomy irrigation as well.       Enrolled patient in [place] program: Yes   [place] Nurse will follow along with you for continued support with ostomy teaching and care [place] MSN, [place], [place], [place], [place]

## 2022-02-06 NOTE — Progress Notes (Signed)
1 Day Post-Op Robotic APR Subjective: No complaints this am, tolerating liquids.  Had some bleeding from his perineal wound yesterday evening  Objective: Vital signs in last 24 hours: Temp:  [97 F (36.1 C)-98 F (36.7 C)] 97.9 F (36.6 C) (07/13 0605) Pulse Rate:  [71-87] 86 (07/13 0605) Resp:  [14-19] 18 (07/13 0605) BP: (106-135)/(62-87) 135/84 (07/13 0605) SpO2:  [91 %-100 %] 98 % (07/13 0737)   Intake/Output from previous day: 07/12 0701 - 07/13 0700 In: 4188.9 [P.O.:780; I.V.:3158.9; IV Piggyback:250] Out: 3200 [Urine:2810; Drains:260; Stool:30; Blood:100] Intake/Output this shift: No intake/output data recorded.   General appearance: alert and cooperative GI: soft, non-distended  Incision: no significant drainage  Lab Results:  Recent Labs    02/06/22 0440  WBC 12.1*  HGB 11.9*  HCT 38.9*  PLT 213   BMET Recent Labs    02/06/22 0440  NA 139  K 4.2  CL 107  CO2 23  GLUCOSE 138*  BUN 7*  CREATININE 0.75  CALCIUM 8.5*   PT/INR No results for input(s): "LABPROT", "INR" in the last 72 hours. ABG No results for input(s): "PHART", "HCO3" in the last 72 hours.  Invalid input(s): "PCO2", "PO2"  MEDS, Scheduled  acetaminophen  1,000 mg Oral Q6H   alvimopan  12 mg Oral BID   amLODipine  10 mg Oral Daily   enoxaparin (LOVENOX) injection  40 mg Subcutaneous Q24H   feeding supplement  237 mL Oral BID BM   gabapentin  300 mg Oral BID   loratadine  10 mg Oral Daily   mometasone-formoterol  2 puff Inhalation BID   And   umeclidinium bromide  1 puff Inhalation Daily   saccharomyces boulardii  250 mg Oral BID    Studies/Results: No results found.  Assessment: s/p Procedure(s): XI ROBOTIC ASSISTED ABDOMINAL PERINEAL RESECTION OSTOMY CREATION Patient Active Problem List   Diagnosis Date Noted   Rectal mass 02/05/2022    Expected post op course  Plan: Advance diet as tolerated SL IVF's  Ambulate Ostomy teaching   LOS: 1 day     .Rosario Adie, MD [facility], [place]    02/06/2022 7:41 AM

## 2022-02-06 NOTE — Progress Notes (Signed)
Bleeding slowed down at this moment. Reinforced pressure  dressing to pt's bottom. Ice pack applied. Will continue to monitor

## 2022-02-06 NOTE — TOC Initial Note (Signed)
Transition of Care [facility]) - Initial/Assessment Note   Patient Details  Name: Jason Mclaughlin MRN: [id number] Date of Birth: [birth year]  [facility]) CM/SW Contact:    Sherie Don, LCSW Phone Number: 02/06/2022, 3:58 PM  Clinical Narrative: Patient is a new ostomy and will need HHRN after discharge. Patient is agreeable to [facility] referral. CSW made referral to the following agencies:  [facility]: declined [facility]: declined, no nursing available in area [place]: declined [facility]: declined, no nursing in area [place]: declined, not currently accepting ostomies [facility]: declined [facility]: declined [facility]: declined [facility]: under review  Expected Discharge Plan: [place] Barriers to Discharge: Continued Medical Work up  Patient Goals and CMS Choice Patient states their goals for this hospitalization and ongoing recovery are:: Return home CMS Medicare.gov Compare Post Acute Care list provided to:: Patient Choice offered to / list presented to : Patient  Expected Discharge Plan and Services Expected Discharge Plan: [place] In-house Referral: Clinical Social Work Post Acute Care Choice: [place] arrangements for the past 2 months: [place]           DME Arranged: N/A DME Agency: NA  Prior Living Arrangements/Services Living arrangements for the past 2 months: Single Family Home Lives with:: Spouse Patient language and need for interpreter reviewed:: Yes Do you feel safe going back to the place where you live?: Yes      Need for Family Participation in Patient Care: No (Comment) Care giver support system in place?: Yes (comment) Criminal Activity/Legal Involvement Pertinent to Current Situation/Hospitalization: No - Comment as needed  Activities of Daily Living Home Assistive Devices/Equipment: Eyeglasses ADL Screening (condition at time of admission) Patient's cognitive ability adequate to safely  complete daily activities?: Yes Is the patient deaf or have difficulty hearing?: Yes Does the patient have difficulty seeing, even when wearing glasses/contacts?: No Does the patient have difficulty concentrating, remembering, or making decisions?: No Patient able to express need for assistance with ADLs?: Yes Does the patient have difficulty dressing or bathing?: No Independently performs ADLs?: Yes (appropriate for developmental age) Does the patient have difficulty walking or climbing stairs?: No Weakness of Legs: None Weakness of Arms/Hands: Both  Permission Sought/Granted Permission sought to share information with : Other (comment) Permission granted to share information with : Yes, Verbal Permission Granted Permission granted to share info w AGENCY: [place] agencies  Emotional Assessment Attitude/Demeanor/Rapport: Engaged Affect (typically observed): Accepting Orientation: : Oriented to Self, Oriented to Place, Oriented to  Time, Oriented to Situation Alcohol / Substance Use: Tobacco Use Psych Involvement: No (comment)  Admission diagnosis:  Rectal mass [K62.89] Patient Active Problem List   Diagnosis Date Noted   Rectal mass 02/05/2022   PCP:  Adaline Sill, NP Pharmacy:   [facility] [address], [place] [address] Phone: [phone] Fax: [phone]  Readmission Risk Interventions     No data to display

## 2022-02-07 ENCOUNTER — Encounter (HOSPITAL_COMMUNITY): Payer: Self-pay | Admitting: Nurse Practitioner

## 2022-02-07 LAB — BASIC METABOLIC PANEL
Anion gap: 8 (ref 5–15)
BUN: 10 mg/dL (ref 8–23)
CO2: 28 mmol/L (ref 22–32)
Calcium: 8.9 mg/dL (ref 8.9–10.3)
Chloride: 106 mmol/L (ref 98–111)
Creatinine, Ser: 0.7 mg/dL (ref 0.61–1.24)
GFR, Estimated: >60 mL/min (ref >60)
Glucose, Bld: 104 mg/dL — ABNORMAL HIGH (ref 70–99)
Potassium: 3.9 mmol/L (ref 3.5–5.1)
Sodium: 142 mmol/L (ref 135–145)

## 2022-02-07 LAB — CBC
HCT: 40.3 % (ref 39.0–52.0)
Hemoglobin: 12.2 g/dL — ABNORMAL LOW (ref 13.0–17.0)
MCH: 24.9 pg — ABNORMAL LOW (ref 26.0–34.0)
MCHC: 30.3 g/dL (ref 30.0–36.0)
MCV: 82.4 fL (ref 80.0–100.0)
Platelets: 211 10*3/uL (ref 150–400)
RBC: 4.89 MIL/uL (ref 4.22–5.81)
RDW: 20.8 % — ABNORMAL HIGH (ref 11.5–15.5)
WBC: 9.9 10*3/uL (ref 4.0–10.5)
nRBC: 0 % (ref 0.0–0.2)

## 2022-02-07 NOTE — Consult Note (Signed)
[facility] Nurse ostomy follow up Stoma type/location: LLQ end colostomy, permanent Stomal assessment/size: 2 inches round, budded,moist Peristomal assessment: Not seen today Treatment options for stomal/peristomal skin: skin barrier ring Output: brown liquid plus flatus Ostomy pouching: 2pc. 2 and 3/4 inch pouching system with skin barrier ring. Pouch is [employer], skin barrier is Kellie Simmering # 2 and skin barrier ring is Kellie Simmering # [phone] Education provided:  Patient denies questions today, is provided with an additional 3 set ups (skin barrier, pouch and skin barrier ring as well as a stoma sizing guide) for discharge purposes. He reports he is hopeful for discharge tomorrow. Enrolled patient in [place] Start Discharge program: Yes   [place] nursing team will follow, and will remain available to this patient, the nursing and medical teams.    Thank you for inviting [place] to participate in this patient's Plan of Care.  Maudie Flakes, MSN, RN, CNS, [place], Serita Grammes, [employer], [employer] phone:  [phone]

## 2022-02-07 NOTE — Discharge Instructions (Signed)
ABDOMINAL SURGERY: POST OP INSTRUCTIONS  DIET: Follow a light bland diet the first 24 hours after arrival home, such as soup, liquids, crackers, etc.  Be sure to include lots of fluids daily.  Avoid fast food or heavy meals as your are more likely to get nauseated.  Do not eat any uncooked fruits or vegetables for the next 2 weeks as your colon heals. Take your usually prescribed home medications unless otherwise directed. PAIN CONTROL: Pain is best controlled by a usual combination of three different methods TOGETHER: Ice/Heat Over the counter pain medication Prescription pain medication Most patients will experience some swelling and bruising around the incisions.  Ice packs or heating pads (30-60 minutes up to 6 times a day) will help. Use ice for the first few days to help decrease swelling and bruising, then switch to heat to help relax tight/sore spots and speed recovery.  Some people prefer to use ice alone, heat alone, alternating between ice & heat.  Experiment to what works for you.  Swelling and bruising can take several weeks to resolve.   It is helpful to take an over-the-counter pain medication regularly for the first few weeks.  Choose one of the following that works best for you: Naproxen (Aleve, etc)  Two '220mg'$  tabs twice a day Ibuprofen (Advil, etc) Three '200mg'$  tabs four times a day (every meal & bedtime) Acetaminophen (Tylenol, etc) 500-'650mg'$  four times a day (every meal & bedtime) A  prescription for pain medication (such as oxycodone, hydrocodone, etc) should be given to you upon discharge.  Take your pain medication as prescribed.  If you are having problems/concerns with the prescription medicine (does not control pain, nausea, vomiting, rash, itching, etc), please call us [phone] to see if we need to switch you to a different pain medicine that will work better for you and/or control your side effect better. If you need a refill on your pain medication, please contact  your pharmacy.  They will contact our office to request authorization. Prescriptions will not be filled after 5 pm or on week-ends. Avoid getting constipated.  Between the surgery and the pain medications, it is common to experience some constipation.  Increasing fluid intake and taking a fiber supplement (such as Metamucil, Citrucel, FiberCon, MiraLax, etc) 1-2 times a day regularly will usually help prevent this problem from occurring.  A mild laxative (prune juice, Milk of Magnesia, MiraLax, etc) should be taken according to package directions if there are no bowel movements after 48 hours.   Watch out for diarrhea.  If you have many loose bowel movements, simplify your diet to bland foods & liquids for a few days.  Stop any stool softeners and decrease your fiber supplement.  Switching to mild anti-diarrheal medications (Kayopectate, Pepto Bismol) can help.  If this worsens or does not improve, please call us. Wash / shower every day.  You may shower over the incision / wound.  Avoid baths until the skin is fully healed.  Continue to shower over incision(s) after the dressing is off. Cover the wound on your bottom daily.  Wash wound with soap and water daily.  Your other wound on your belly can be left open to air and covered if needed.   Empty your drain daily.  You do not need to record your output ACTIVITIES as tolerated:   You may resume regular (light) daily activities beginning the next day--such as daily self-care, walking, climbing stairs--gradually increasing activities as tolerated.  If you can walk  30 minutes without difficulty, it is safe to try more intense activity such as jogging, treadmill, bicycling, low-impact aerobics, swimming, etc. Save the most intensive and strenuous activity for last such as sit-ups, heavy lifting, contact sports, etc  Refrain from any heavy lifting or straining until you are off narcotics for pain control.   DO NOT PUSH THROUGH PAIN.  Let pain be your guide: If  it hurts to do something, don't do it.  Pain is your body warning you to avoid that activity for another week until the pain goes down. You may drive when you are no longer taking prescription pain medication, you can comfortably wear a seatbelt, and you can safely maneuver your car and apply brakes. You may have sexual intercourse when it is comfortable.  FOLLOW UP in our office Please call [facility] at [phone] to set up an appointment to see your surgeon in the office for a follow-up appointment approximately 1-2 weeks after your surgery. Make sure that you call for this appointment the day you arrive home to insure a convenient appointment time. 10. IF YOU HAVE DISABILITY OR FAMILY LEAVE FORMS, BRING THEM TO THE OFFICE FOR PROCESSING.  DO NOT GIVE THEM TO YOUR DOCTOR.  WHEN TO CALL us [phone]: Poor pain control Reactions / problems with new medications (rash/itching, nausea, etc)  Fever over 101.5 F (38.5 C) Inability to urinate Nausea and/or vomiting Worsening swelling or bruising Continued bleeding from incision. Increased pain, redness, or drainage from the incision  The clinic staff is available to answer your questions during regular business hours (8:30am-5pm).  Please don't hesitate to call and ask to speak to one of our nurses for clinical concerns.   A surgeon from [facility] is always on call at the hospitals   If you have a medical emergency, go to the nearest emergency room or call 911.    [facility], [place], [place], [place], [place]  [address] ? MAIN: [phone] ? TOLL FREE: [phone] ? FAX [phone] [email or web address]

## 2022-02-07 NOTE — Progress Notes (Signed)
2 Days Post-Op Robotic APR Subjective: No complaints this am, tolerating solid foods.  Ambulating.    Objective: Vital signs in last 24 hours: Temp:  [97.5 F (36.4 C)-98.2 F (36.8 C)] 98.2 F (36.8 C) (07/14 0419) Pulse Rate:  [73-91] 91 (07/14 0419) Resp:  [16-20] 16 (07/14 0419) BP: (118-139)/(76-91) 139/84 (07/14 0419) SpO2:  [97 %-100 %] 97 % (07/14 0811) Weight:  [74.5 kg] 74.5 kg (07/14 0500)   Intake/Output from previous day: 07/13 0701 - 07/14 0700 In: 1604.9 [P.O.:1080; I.V.:524.9] Out: 3635 [Urine:3210; Drains:125; Stool:300] Intake/Output this shift: No intake/output data recorded.   General appearance: alert and cooperative GI: soft, non-distended  Incision: no significant drainage  Lab Results:  Recent Labs    02/06/22 0440 02/07/22 0447  WBC 12.1* 9.9  HGB 11.9* 12.2*  HCT 38.9* 40.3  PLT 213 211    BMET Recent Labs    02/06/22 0440 02/07/22 0447  NA 139 142  K 4.2 3.9  CL 107 106  CO2 23 28  GLUCOSE 138* 104*  BUN 7* 10  CREATININE 0.75 0.70  CALCIUM 8.5* 8.9    PT/INR No results for input(s): "LABPROT", "INR" in the last 72 hours. ABG No results for input(s): "PHART", "HCO3" in the last 72 hours.  Invalid input(s): "PCO2", "PO2"  MEDS, Scheduled  acetaminophen  1,000 mg Oral Q6H   alvimopan  12 mg Oral BID   amLODipine  10 mg Oral Daily   Chlorhexidine Gluconate Cloth  6 each Topical Daily   enoxaparin (LOVENOX) injection  40 mg Subcutaneous Q24H   feeding supplement  237 mL Oral BID BM   gabapentin  300 mg Oral BID   loratadine  10 mg Oral Daily   mometasone-formoterol  2 puff Inhalation BID   And   umeclidinium bromide  1 puff Inhalation Daily   saccharomyces boulardii  250 mg Oral BID    Studies/Results: No results found.  Assessment: s/p Procedure(s): XI ROBOTIC ASSISTED ABDOMINAL PERINEAL RESECTION OSTOMY CREATION Patient Active Problem List   Diagnosis Date Noted   Rectal mass 02/05/2022    Expected post  op course  Plan: Cont soft diet SL IVF's  Ambulate Cont ostomy teaching Cont JP for 1 more week   LOS: 2 days     .Rosario Adie, MD [facility], [place]    02/07/2022 8:16 AM

## 2022-02-07 NOTE — TOC Progression Note (Signed)
Transition of Care [facility]) - Progression Note   Patient Details  Name: Jason Mclaughlin MRN: [id number] Date of Birth: Dec 05, 1955  Transition of Care [facility]) CM/SW [place], LCSW Phone Number: 02/07/2022, 2:02 PM  Clinical Narrative: CSW notified by Levada Dy with [facility] that the referral was not accepted. CSW made [facility] referral to [facility] and [facility], but both agencies declined since they do not have nursing availability in the area. CSW updated patient and RN. CSW notified [place] triage RN, Abigail Butts, that all [facility] agencies declined the patient as patient may need to be referred to the [facility] ostomy clinic.  Expected Discharge Plan: [place] Barriers to Discharge: Continued Medical Work up, No [place] will accept this patient  Expected Discharge Plan and Services Expected Discharge Plan: [place] In-house Referral: Clinical Social Work Post Acute Care Choice: [place] arrangements for the past 2 months: Single Family Home              DME Arranged: N/A DME Agency: NA  Readmission Risk Interventions     No data to display

## 2022-02-08 ENCOUNTER — Other Ambulatory Visit (HOSPITAL_COMMUNITY): Payer: Self-pay

## 2022-02-08 MED ORDER — TRAMADOL HCL 50 MG PO TABS
50.0000 mg | ORAL_TABLET | Freq: Four times a day (QID) | ORAL | 0 refills | Status: DC | PRN
  Filled 2022-02-08: qty 25, 7d supply, fill #0

## 2022-02-08 NOTE — Plan of Care (Signed)

## 2022-02-08 NOTE — Progress Notes (Signed)
Assessment unchanged. Pt verbalized understanding of dc instructions including medication and follow up care, as well as JP drain care and colostomy care. Demonstrated emptying and recharging JP drain; and successfully emptied, cleaned and and resealed ostomy in BR prior to leaving. Ostomy supplies at bedside as provided by [facility] packed up for pt. Discharged via foot per request accompanied by family to front entrance.

## 2022-02-08 NOTE — Progress Notes (Signed)
Patient ID: Jason Mclaughlin, male   DOB: 1956-03-16, 66 y.o.   MRN: [id number]   He is doing well and wants to go home Ostomy working better Pain well controlled Perineal wound clean Drain serosang  Plan: Discharge home

## 2022-02-10 LAB — SURGICAL PATHOLOGY

## 2022-02-10 NOTE — Discharge Summary (Signed)
Patient ID: Jason Mclaughlin [id number] 66 y.o. [birth year]  02/05/2022  Discharge date and time: 02/08/2022 10:21 AM  Admitting Physician: Rosario Adie  Discharge Physician: Nedra Hai  Admission Diagnoses: Rectal mass [K62.89]  Discharge Diagnoses: rectal mass  Operations:  XI ROBOTIC ASSISTED ABDOMINAL PERINEAL RESECTION OSTOMY CREATION    Discharged Condition: good    Hospital Course: Patient was admitted to the med surg floor after surgery.  Diet was advanced as tolerated.  Patient began to have bowel function on postop day 3.  By postop day 3, he was tolerating a solid diet and pain was controlled with oral medications.  He was urinating without difficulty and ambulating without assistance.  Patient was felt to be in stable condition for discharge to home.   Consults: WOC  Significant Diagnostic Studies: labs: cbc, bmet  Treatments: IV hydration, analgesia: acetaminophen, and surgery: robotic APR  Disposition: Home

## 2022-02-14 ENCOUNTER — Ambulatory Visit (HOSPITAL_COMMUNITY)
Admission: RE | Admit: 2022-02-14 | Discharge: 2022-02-14 | Disposition: A | Discharge status: Home / Self Care | Payer: Medicare Other | Source: Ambulatory Visit | Attending: Nurse Practitioner | Admitting: Nurse Practitioner

## 2022-02-14 DIAGNOSIS — Z433 Encounter for attention to colostomy: Secondary | ICD-10-CM | POA: Diagnosis not present

## 2022-02-14 DIAGNOSIS — Z719 Counseling, unspecified: Secondary | ICD-10-CM | POA: Diagnosis not present

## 2022-02-14 DIAGNOSIS — Z933 Colostomy status: Secondary | ICD-10-CM | POA: Insufficient documentation

## 2022-02-14 NOTE — Discharge Instructions (Signed)
Continue 2 piece pouch Added ostomy belt I will set up with [facility]

## 2022-02-14 NOTE — Progress Notes (Cosign Needed)
[facility]   Reason for visit:  LLQ permanent colostomy HPI:  Perineal resection with colostomy ROS  Review of Systems  Constitutional:        Increasing activity  Gastrointestinal:        Healing midline surgical wound RLQ penrose drain LLQ colostomy  Skin:  Positive for wound.       Midline abdominal wound  All other systems reviewed and are negative.  Vital signs:  BP 121/85   Pulse 96   Temp 97.9 F (36.6 C)   Resp 17   SpO2 97%  Exam:  Physical Exam Vitals reviewed.  Constitutional:      Appearance: He is normal weight.  Abdominal:     Hernia: No hernia is present.     Comments: Parastomal bulging at 3 o'clock  Skin:    General: Skin is warm.  Neurological:     Mental Status: He is alert and oriented to person, place, and time.  Psychiatric:        Mood and Affect: Mood normal.        Behavior: Behavior normal.     Stoma type/location:  LLQ colostomy, well budded Stomal assessment/size:  2" pink and moist Peristomal assessment:  intact, some bulging at 3 o'clock.  Treatment options for stomal/peristomal skin: ostomy belt Output:thick brown stool Ostomy pouching: 2pc. 2 3/4" pouch with filter. Recommend filter and ostomy belt   Education provided:  states he feels blocked up at times and is decreasing food.  We discuss incorporating miralax once daily until moving smoothly and taking daily colace.  He is drinking a lot of water and increasing activity.  Would like to begin fishing again. I suggest an ostomy belt to provide support and security while active.  I place him in the belt and he is pleased with how this fits.     Impression/dx  colostomy Discussion  Benefit of filtered pouch, showering with pouch once cleared.  Stool softeners Plan  See back in 2 weeks.     Visit time: 60 minutes.   Maple Hudson FNP-BC

## 2022-02-17 ENCOUNTER — Other Ambulatory Visit (HOSPITAL_COMMUNITY): Payer: Self-pay | Admitting: Nurse Practitioner

## 2022-02-17 DIAGNOSIS — Z433 Encounter for attention to colostomy: Secondary | ICD-10-CM

## 2022-02-18 ENCOUNTER — Encounter (HOSPITAL_COMMUNITY): Payer: Self-pay | Admitting: Nurse Practitioner

## 2022-03-03 ENCOUNTER — Telehealth: Payer: Self-pay

## 2022-03-03 ENCOUNTER — Encounter (HOSPITAL_COMMUNITY)
Admission: RE | Admit: 2022-03-03 | Discharge: 2022-03-03 | Disposition: A | Discharge status: Home / Self Care | Payer: Medicare Other | Source: Ambulatory Visit | Attending: Internal Medicine | Admitting: Internal Medicine

## 2022-03-03 DIAGNOSIS — Z01812 Encounter for preprocedural laboratory examination: Secondary | ICD-10-CM | POA: Insufficient documentation

## 2022-03-03 DIAGNOSIS — K9400 Colostomy complication, unspecified: Secondary | ICD-10-CM | POA: Diagnosis not present

## 2022-03-03 DIAGNOSIS — K432 Incisional hernia without obstruction or gangrene: Secondary | ICD-10-CM | POA: Diagnosis not present

## 2022-03-03 DIAGNOSIS — Z933 Colostomy status: Secondary | ICD-10-CM | POA: Diagnosis not present

## 2022-03-03 NOTE — Progress Notes (Signed)
[facility]   Reason for visit:  LLQ colostomy HPI:  Perineal resection with colostomy ROS  Review of Systems  Gastrointestinal:        LLQ colostomy with parastomal hernia  Skin: Negative.   Psychiatric/Behavioral: Negative.    All other systems reviewed and are negative.  Vital signs:  There were no vitals taken for this visit. Exam:  Physical Exam Vitals reviewed.  Constitutional:      Appearance: Normal appearance.  Abdominal:     Palpations: Abdomen is soft.     Hernia: A hernia is present.     Comments: LLQ colostomy with parastomal hernia at 3 o'clock  Skin:    General: Skin is warm and dry.  Neurological:     Mental Status: He is alert and oriented to person, place, and time.  Psychiatric:        Mood and Affect: Mood normal.        Behavior: Behavior normal.     Stoma type/location:  LLQ colostomy Stomal assessment/size:  2" pink and moist Peristomal assessment:  intact  bulging at 3 o'clock remains.  Wears belt and pouch cover Treatment options for stomal/peristomal skin: belt due to bulging consistent with hernia Output: thick brown stool  improved with miralax daily.  Ostomy pouching: 2pc. 2 3/4" pouch with belt  Education provided:  Patient states he is not having any issues with his pouching. He is set up with [facility] for supplies and would like me to place an additional order for 90 days soon.  I am happy to do this He has been fishing and eating well.  Output is less thick at this time.     Impression/dx  LLQ colostomy Discussion  See above   Supplies Constipation Hernia support Plan  See back as needed. No follow up appointment scheduled at this time.     Visit time: 40 minutes.   Domenic Moras FNP-BC

## 2022-03-03 NOTE — Discharge Instructions (Signed)
Will re order supplies with [facility].

## 2022-03-03 NOTE — Telephone Encounter (Signed)
Consult note from [facility] on desk for review. Also in chart scanned in under media in patient's chart.

## 2022-03-04 ENCOUNTER — Telehealth: Payer: Self-pay | Admitting: Genetic Counselor

## 2022-03-04 NOTE — Telephone Encounter (Signed)
Scheduled appt per 8/8 referral. Pt is aware of appt date and time. Pt is aware to arrive 15 mins prior to appt time and to bring and updated insurance card. Pt is aware of appt location.   

## 2022-03-10 ENCOUNTER — Encounter: Payer: Self-pay | Admitting: Genetic Counselor

## 2022-03-13 ENCOUNTER — Other Ambulatory Visit: Payer: Self-pay

## 2022-03-13 ENCOUNTER — Encounter: Payer: Self-pay | Admitting: Genetic Counselor

## 2022-03-13 ENCOUNTER — Other Ambulatory Visit: Payer: Self-pay | Admitting: Genetic Counselor

## 2022-03-13 ENCOUNTER — Inpatient Hospital Stay: Payer: Medicare Other

## 2022-03-13 ENCOUNTER — Inpatient Hospital Stay: Payer: Medicare Other | Attending: General Surgery | Admitting: Genetic Counselor

## 2022-03-13 DIAGNOSIS — Z801 Family history of malignant neoplasm of trachea, bronchus and lung: Secondary | ICD-10-CM

## 2022-03-13 DIAGNOSIS — Z800 Family history of malignant neoplasm of digestive organs: Secondary | ICD-10-CM

## 2022-03-13 DIAGNOSIS — D126 Benign neoplasm of colon, unspecified: Secondary | ICD-10-CM | POA: Diagnosis not present

## 2022-03-13 DIAGNOSIS — K6289 Other specified diseases of anus and rectum: Secondary | ICD-10-CM

## 2022-03-13 DIAGNOSIS — I10 Essential (primary) hypertension: Secondary | ICD-10-CM

## 2022-03-13 DIAGNOSIS — F17210 Nicotine dependence, cigarettes, uncomplicated: Secondary | ICD-10-CM

## 2022-03-13 LAB — GENETIC SCREENING ORDER

## 2022-03-13 NOTE — Progress Notes (Signed)
REFERRING PROVIDER: Leighton Ruff, MD [address] [place],  [place] [address]  PRIMARY PROVIDER:  Adaline Sill, NP  PRIMARY REASON FOR VISIT:  1. Family history of colon cancer   2. Polyposis coli   3. Rectal mass      HISTORY OF PRESENT ILLNESS:   Mr. Sahlin, a 66 y.o. male, was seen for a [place] cancer genetics consultation at the request of Dr. Marcello Moores due to a personal and family history of colon cancer.  Mr. Reetz presents to clinic today to discuss the possibility of a hereditary predisposition to cancer, genetic testing, and to further clarify his future cancer risks, as well as potential cancer risks for family members.   In April 2023, at the age of 66, Mr. Coburn was diagnosed with rectal cancer.  The colonoscopy found 50+ polyps.    CANCER HISTORY:  Oncology History   No history exists.    Past Medical History:  Diagnosis Date   Arthritis    COPD (chronic obstructive pulmonary disease) ([place])    Family history of colon cancer    Hypertension    Pneumonia    Polyposis coli    Tinnitus     Past Surgical History:  Procedure Laterality Date   BIOPSY  10/28/2021   Procedure: BIOPSY;  Surgeon: Daneil Dolin, MD;  [facility];  Service: Endoscopy;;   COLONOSCOPY WITH PROPOFOL N/A 10/28/2021   Procedure: COLONOSCOPY WITH PROPOFOL;  Surgeon: Daneil Dolin, MD;  Location[facility];  Service: Endoscopy;  Laterality: N/A;  2:15pm   HOT HEMOSTASIS  10/28/2021   Procedure: HOT HEMOSTASIS (ARGON PLASMA COAGULATION/BICAP);  Surgeon: Daneil Dolin, MD;  Location: [facility];  Service: Endoscopy;;   MULTIPLE TOOTH EXTRACTIONS     OSTOMY N/A 02/05/2022   Procedure: OSTOMY CREATION;  Surgeon: Leighton Ruff, MD;  Location: [facility];  Service: General;  Laterality: N/A;   POLYPECTOMY  10/28/2021   Procedure: POLYPECTOMY;  Surgeon: Daneil Dolin, MD;  [facility];  Service: Endoscopy;;   XI ROBOTIC ASSISTED LOWER  ANTERIOR RESECTION N/A 02/05/2022   Procedure: XI ROBOTIC ASSISTED ABDOMINAL PERINEAL RESECTION;  Surgeon: Leighton Ruff, MD;  Location: [facility];  Service: General;  Laterality: N/A;    Social History   Socioeconomic History   Marital status: Single    Spouse name: Not on file   Number of children: Not on file   Years of education: Not on file   Highest education level: Not on file  Occupational History   Not on file  Tobacco Use   Smoking status: Every Day    Packs/day: 1.00    Types: Cigarettes   Smokeless tobacco: Current    Types: Snuff  Vaping Use   Vaping Use: Never used  Substance and Sexual Activity   Alcohol use: Yes    Alcohol/week: 14.0 standard drinks of alcohol    Types: 14 Cans of beer per week   Drug use: Never    Comment: CBD for arthritis pain   Sexual activity: Never  Other Topics Concern   Not on file  Social History Narrative   ** Merged History Encounter **       Social Determinants of Health   Financial Resource Strain: Not on file  Food Insecurity: Not on file  Transportation Needs: Not on file  Physical Activity: Not on file  Stress: Not on file  Social Connections: Not on file     FAMILY  HISTORY:  We obtained a detailed, 4-generation family history.  Significant diagnoses are listed below: Family History  Problem Relation Age of Onset   Lung cancer Mother    Colon cancer Father        late 52s   Brain cancer Maternal Grandmother       The patient has two daughters who are cancer free. He has three sisters and a brother who are cancer free.  His parents are deceased.  The patient's father had colon cancer in his late 3's.  He has six siblings who are cancer fee.  The paternal grandparents are deceased.  The patient's mother had lung cancer.  She had five siblings who are cancer free.  The maternal grandparents are deceased.  Mr. Kari is unaware of previous family history of genetic testing for hereditary cancer risks.  Patient's maternal ancestors are of Caucasian descent, and paternal ancestors are of Caucasian descent. There is no reported Ashkenazi Jewish ancestry. There is no known consanguinity.  GENETIC COUNSELING ASSESSMENT: Mr. Mazur is a 66 y.o. male with a personal and family history of cancer and a personal history of polyposis which is somewhat suggestive of a hereditary colon cancer syndrome and predisposition to cancer given the number of colon polyps and the association with colon cancer. We, therefore, discussed and recommended the following at today's visit.   DISCUSSION: We discussed that, in general, most cancer is not inherited in families, but instead is sporadic or familial. Sporadic cancers occur by chance and typically happen at older ages (>50 years) as this type of cancer is caused by genetic changes acquired during an individual's lifetime. Some families have more cancers than would be expected by chance; however, the ages or types of cancer are not consistent with a known genetic mutation or known genetic mutations have been ruled out. This type of familial cancer is thought to be due to a combination of multiple genetic, environmental, hormonal, and lifestyle factors. While this combination of factors likely increases the risk of cancer, the exact source of this risk is not currently identifiable or testable.  We discussed that 5 - 7% of colon cancer is hereditary, with most cases associated with Lynch syndrome.  Polyposis is more commonly associated with APC mutation.  There are other genes that can be associated with hereditary colon cancer and polyposis syndromes.  These include MUTYH and BMPR1A.  We discussed that testing is beneficial for several reasons including knowing how to follow individuals after completing their treatment, identifying whether potential treatment options such as PARP inhibitors would be beneficial, and understand if other family members could be at risk for cancer  and allow them to undergo genetic testing.   We reviewed the characteristics, features and inheritance patterns of hereditary cancer syndromes. We also discussed genetic testing, including the appropriate family members to test, the process of testing, insurance coverage and turn-around-time for results. We discussed the implications of a negative, positive, carrier and/or variant of uncertain significant result. We recommended Mr. Hardgrove pursue genetic testing for the CancerNext-Expanded+RNAinsight gene panel.   The CancerNext-Expanded gene panel offered by [facility] and includes sequencing and rearrangement analysis for the following 77 genes: AIP, ALK, APC*, ATM*, AXIN2, BAP1, BARD1, BLM, BMPR1A, BRCA1*, BRCA2*, BRIP1*, CDC73, CDH1*, CDK4, CDKN1B, CDKN2A, CHEK2*, CTNNA1, DICER1, FANCC, FH, FLCN, GALNT12, KIF1B, LZTR1, MAX, MEN1, MET, MLH1*, MSH2*, MSH3, MSH6*, MUTYH*, NBN, NF1*, NF2, NTHL1, PALB2*, PHOX2B, PMS2*, POT1, PRKAR1A, PTCH1, PTEN*, RAD51C*, RAD51D*, RB1, RECQL, RET, SDHA, SDHAF2, SDHB, SDHC, SDHD,  SMAD4, SMARCA4, SMARCB1, SMARCE1, STK11, SUFU, TMEM127, TP53*, TSC1, TSC2, VHL and XRCC2 (sequencing and deletion/duplication); EGFR, EGLN1, HOXB13, KIT, MITF, PDGFRA, POLD1, and POLE (sequencing only); EPCAM and GREM1 (deletion/duplication only). DNA and RNA analyses performed for * genes.   Based on Mr. Hillmer personal and family history of cancer, he meets medical criteria for genetic testing. Despite that he meets criteria, he may still have an out of pocket cost. We discussed that if his out of pocket cost for testing is over $100, the laboratory will call and confirm whether he wants to proceed with testing.  If the out of pocket cost of testing is less than $100 he will be billed by the genetic testing laboratory.   PLAN: After considering the risks, benefits, and limitations, Mr. Yasuda provided informed consent to pursue genetic testing and the blood sample was sent to [facility] for analysis of the CancerNext-Expanded+RNAinsight. Results should be available within approximately 2-3 weeks' time, at which point they will be disclosed by telephone to Mr. Sparling, as will any additional recommendations warranted by these results. Mr. Mckiver will receive a summary of his genetic counseling visit and a copy of his results once available. This information will also be available in Epic.   Lastly, we encouraged Mr. Linford to remain in contact with cancer genetics annually so that we can continuously update the family history and inform him of any changes in cancer genetics and testing that may be of benefit for this family.   Mr. Morones questions were answered to his satisfaction today. Our contact information was provided should additional questions or concerns arise. Thank you for the referral and allowing [place] to share in the care of your patient.   Alek Poncedeleon P. Florene Glen, [place], [facility] Licensed, Insurance risk surveyor [email or web address] phone: [phone]  The patient was seen for a total of 35 minutes in face-to-face genetic counseling.  The patient was seen alone.  This patient was discussed with Drs. Magrinat, Lindi Adie and/or Burr Medico who agrees with the above.    _______________________________________________________________________ For Office Staff:  Number of people involved in session: 1 Was an Intern/ student involved with case: no

## 2022-03-28 ENCOUNTER — Ambulatory Visit (HOSPITAL_COMMUNITY)
Admission: RE | Admit: 2022-03-28 | Discharge: 2022-03-28 | Disposition: A | Discharge status: Home / Self Care | Payer: Medicare Other | Source: Ambulatory Visit | Attending: Nurse Practitioner | Admitting: Nurse Practitioner

## 2022-03-28 DIAGNOSIS — K9400 Colostomy complication, unspecified: Secondary | ICD-10-CM | POA: Diagnosis not present

## 2022-03-28 DIAGNOSIS — L24B3 Irritant contact dermatitis related to fecal or urinary stoma or fistula: Secondary | ICD-10-CM | POA: Diagnosis not present

## 2022-03-28 DIAGNOSIS — K9409 Other complications of colostomy: Secondary | ICD-10-CM | POA: Diagnosis not present

## 2022-03-28 DIAGNOSIS — Z933 Colostomy status: Secondary | ICD-10-CM | POA: Diagnosis present

## 2022-03-28 NOTE — Discharge Instructions (Signed)
Call clinic as needed Cut opening to 1 3/4  continue barrier ring

## 2022-03-28 NOTE — Progress Notes (Signed)
[facility]   Reason for visit:  LLQ colostomy, concerned with lesion on stoma HPI:  Perineal resection with colostomy ROS  Review of Systems  Gastrointestinal:        LLQ colostomy  Skin:  Positive for wound.       White tissue present to stoma, consistent with trauma from cutting pouch opening too small. Prolapsed stoma  Psychiatric/Behavioral: Negative.    All other systems reviewed and are negative.  Vital signs:  BP (!) 133/94   Pulse 99   Temp 98.1 F (36.7 C)   Resp 18   SpO2 96%  Exam:  Physical Exam Constitutional:      Appearance: Normal appearance.  Abdominal:     Palpations: Abdomen is soft.  Skin:    General: Skin is warm and dry.  Neurological:     Mental Status: He is alert and oriented to person, place, and time.  Psychiatric:        Mood and Affect: Mood normal.        Behavior: Behavior normal.     Stoma type/location:  LLQ colostomy, slightly prolapsed Stomal assessment/size:  Upper segment of stoma has white patch of tissue consistent with stomal trauma.  Patient has been cutting pouch opening at 1 1/2" and that is very snug against the stoma.  I measure and recommend 1 3/4".  This leaves some room around the stoma, but patient wears a barrier ring and no skin will be exposed to effluent.  Peristomal assessment:  intact, clips abdominal hair regularly.  Treatment options for stomal/peristomal skin: Larger opening cut, skin protectant and barrier ring. Output: soft brown stool Ostomy pouching: 2pc. 2 3/4 " pouch  Education provided:  I demonstrate cutting new barrier size.  I cut barrier off center to avoid umbilicus and slide pouch over a bit.  I send him home with a pattern and template to do this going forward.     Impression/dx  Stoma trauma Colostomy Discussion  Cut opening larger, cut off center to avoid umbilicus and improve seal Plan  Call [facility] to discuss why he did not receive lubricating deodorant.  Only received 30 days  and thought he would get 90 Days supply.     Visit time: 40 minutes.   Domenic Moras FNP-BC

## 2022-04-01 ENCOUNTER — Telehealth: Payer: Self-pay | Admitting: Genetic Counselor

## 2022-04-01 ENCOUNTER — Ambulatory Visit: Payer: Self-pay | Admitting: Genetic Counselor

## 2022-04-01 ENCOUNTER — Encounter: Payer: Self-pay | Admitting: Genetic Counselor

## 2022-04-01 DIAGNOSIS — Z1379 Encounter for other screening for genetic and chromosomal anomalies: Secondary | ICD-10-CM

## 2022-04-01 NOTE — Telephone Encounter (Signed)
Revealed negative genetic testing.  Discussed that we do not know why he has polyposis and colon cancer or why there is cancer in the family. It could be due to a different gene that we are not testing, or maybe our current technology may not be able to pick something up.  It will be important for him to keep in contact with genetics to keep up with whether additional testing may be needed.   There were two VUS's identified.  These will not change medical management.

## 2022-04-01 NOTE — Progress Notes (Signed)
HPI:  Mr. Marsico was previously seen in the [facility] due to a personal and family history of colon cancer and personal history of polyposis and concerns regarding a hereditary predisposition to cancer. Please refer to our prior cancer genetics clinic note for more information regarding our discussion, assessment and recommendations, at the time. Mr. Manthey recent genetic test results were disclosed to him, as were recommendations warranted by these results. These results and recommendations are discussed in more detail below.  CANCER HISTORY:  Oncology History   No history exists.    FAMILY HISTORY:  We obtained a detailed, 4-generation family history.  Significant diagnoses are listed below: Family History  Problem Relation Age of Onset   Lung cancer Mother    Colon cancer Father        late 76s   Brain cancer Maternal Grandmother        The patient has two daughters who are cancer free. He has three sisters and a brother who are cancer free.  His parents are deceased.   The patient's father had colon cancer in his late 55's.  He has six siblings who are cancer fee.  The paternal grandparents are deceased.   The patient's mother had lung cancer.  She had five siblings who are cancer free.  The maternal grandparents are deceased.   Mr. Turano is unaware of previous family history of genetic testing for hereditary cancer risks. Patient's maternal ancestors are of Caucasian descent, and paternal ancestors are of Caucasian descent. There is no reported Ashkenazi Jewish ancestry. There is no known consanguinity.  GENETIC TEST RESULTS: Genetic testing reported out on March 27, 2022 through the CancerNext-Expanded+RNAinsight cancer panel found no pathogenic mutations. The CancerNext-Expanded gene panel offered by [facility] and includes sequencing and rearrangement analysis for the following 77 genes: AIP, ALK, APC*, ATM*, AXIN2, BAP1, BARD1, BLM, BMPR1A,  BRCA1*, BRCA2*, BRIP1*, CDC73, CDH1*, CDK4, CDKN1B, CDKN2A, CHEK2*, CTNNA1, DICER1, FANCC, FH, FLCN, GALNT12, KIF1B, LZTR1, MAX, MEN1, MET, MLH1*, MSH2*, MSH3, MSH6*, MUTYH*, NBN, NF1*, NF2, NTHL1, PALB2*, PHOX2B, PMS2*, POT1, PRKAR1A, PTCH1, PTEN*, RAD51C*, RAD51D*, RB1, RECQL, RET, SDHA, SDHAF2, SDHB, SDHC, SDHD, SMAD4, SMARCA4, SMARCB1, SMARCE1, STK11, SUFU, TMEM127, TP53*, TSC1, TSC2, VHL and XRCC2 (sequencing and deletion/duplication); EGFR, EGLN1, HOXB13, KIT, MITF, PDGFRA, POLD1, and POLE (sequencing only); EPCAM and GREM1 (deletion/duplication only). DNA and RNA analyses performed for * genes. The test report has been scanned into EPIC and is located under the Molecular Pathology section of the Results Review tab.  A portion of the result report is included below for reference.      We discussed with Mr. Betzer that because current genetic testing is not perfect, it is possible there may be a gene mutation in one of these genes that current testing cannot detect, but that chance is small.  We also discussed, that there could be another gene that has not yet been discovered, or that we have not yet tested, that is responsible for the cancer diagnoses in the family. It is also possible there is a hereditary cause for the cancer in the family that Mr. Schopf did not inherit and therefore was not identified in his testing.  Therefore, it is important to remain in touch with cancer genetics in the future so that we can continue to offer Mr. Goettel the most up to date genetic testing.   Genetic testing did identify two Variants of uncertain significance (VUS) - one in the BRCA1 gene called  Y.E3343H (c.3010G>C),and  a second in the CDKN1B gene called p.I144R (c.589C>A).  At this time, it is unknown if these variants are associated with increased cancer risk or if they are normal findings, but most variants such as these get reclassified to being inconsequential. They should not be used to make medical  management decisions. With time, we suspect the lab will determine the significance of these variants, if any. If we do learn more about them, we will try to contact Mr. Zarazua to discuss it further. However, it is important to stay in touch with [place] periodically and keep the address and phone number up to date.  ADDITIONAL GENETIC TESTING: We discussed with Mr. Bonsignore that his genetic testing was fairly extensive.  If there are genes identified to increase cancer risk that can be analyzed in the future, we would be happy to discuss and coordinate this testing at that time.    CANCER SCREENING RECOMMENDATIONS: Mr. Grey test result is considered negative (normal).  This means that we have not identified a hereditary cause for his personal and family history of colon cancer and personal history of polyposis at this time. Most cancers happen by chance and this negative test suggests that his cancer may fall into this category.    While reassuring, this does not definitively rule out a hereditary predisposition to cancer. It is still possible that there could be genetic mutations that are undetectable by current technology. There could be genetic mutations in genes that have not been tested or identified to increase cancer risk.  Therefore, it is recommended he continue to follow the cancer management and screening guidelines provided by his oncology and primary healthcare provider.   An individual's cancer risk and medical management are not determined by genetic test results alone. Overall cancer risk assessment incorporates additional factors, including personal medical history, family history, and any available genetic information that may result in a personalized plan for cancer prevention and surveillance  This negative genetic test simply tells [place] that we cannot yet define why Mr. Tith has had an increased number of colorectal polyps. Mr. Molyneux medical management and screening should  be based on the prospect that he will likely form more colon polyps in the future and should, therefore, undergo more frequent colonoscopy screening at intervals determined by his GI providers.  We also recommended that Mr. Casanova have an upper endoscopy periodically.  RECOMMENDATIONS FOR FAMILY MEMBERS:  Individuals in this family might be at some increased risk of developing cancer, over the general population risk, simply due to the family history of cancer.  We recommended women in this family have a yearly mammogram beginning at age 10, or 61 years younger than the earliest onset of cancer, an annual clinical breast exam, and perform monthly breast self-exams. Women in this family should also have a gynecological exam as recommended by their primary provider. All family members should be referred for colonoscopy starting at age 89.  FOLLOW-UP: Lastly, we discussed with Mr. Rallis that cancer genetics is a rapidly advancing field and it is possible that new genetic tests will be appropriate for him and/or his family members in the future. We encouraged him to remain in contact with cancer genetics on an annual basis so we can update his personal and family histories and let him know of advances in cancer genetics that may benefit this family.   Our contact number was provided. Mr. Flippen questions were answered to his satisfaction, and he knows he is welcome to call us  at anytime with additional questions or concerns.   Roma Kayser, [place], [facility] Licensed, Certified Genetic Counselor [email or web address]

## 2022-04-21 ENCOUNTER — Other Ambulatory Visit (HOSPITAL_COMMUNITY): Payer: Self-pay | Admitting: Nurse Practitioner

## 2022-04-21 DIAGNOSIS — Z433 Encounter for attention to colostomy: Secondary | ICD-10-CM

## 2022-06-03 ENCOUNTER — Ambulatory Visit (HOSPITAL_COMMUNITY)
Admission: RE | Admit: 2022-06-03 | Discharge: 2022-06-03 | Disposition: A | Discharge status: Home / Self Care | Payer: Medicare Other | Source: Ambulatory Visit | Attending: Nurse Practitioner | Admitting: Nurse Practitioner

## 2022-06-03 DIAGNOSIS — L259 Unspecified contact dermatitis, unspecified cause: Secondary | ICD-10-CM | POA: Diagnosis present

## 2022-06-03 DIAGNOSIS — L24B3 Irritant contact dermatitis related to fecal or urinary stoma or fistula: Secondary | ICD-10-CM | POA: Diagnosis not present

## 2022-06-03 DIAGNOSIS — Z933 Colostomy status: Secondary | ICD-10-CM | POA: Insufficient documentation

## 2022-06-03 DIAGNOSIS — Z433 Encounter for attention to colostomy: Secondary | ICD-10-CM | POA: Diagnosis not present

## 2022-06-03 NOTE — Discharge Instructions (Signed)
Apply steroid spray to redness with pouch change

## 2022-06-03 NOTE — Progress Notes (Signed)
[facility]   Reason for visit:  LLQ colostomy HPI:  Perineal resection with colostomy ROS  Review of Systems  Gastrointestinal:        Luq colostomy  Skin:  Positive for color change.  All other systems reviewed and are negative.  Vital signs:  BP (!) 125/91   Pulse 87   Temp 97.8 F (36.6 C) (Oral)   Resp 18   SpO2 97%  Exam:  Physical Exam Vitals reviewed.  Constitutional:      Appearance: Normal appearance.  Abdominal:     Palpations: Abdomen is soft.  Skin:    General: Skin is warm and dry.     Findings: Rash present.  Neurological:     Mental Status: He is alert and oriented to person, place, and time.  Psychiatric:        Mood and Affect: Mood normal.        Behavior: Behavior normal.     Stoma type/location:  1 1/2"  cutting barrier opening larger and lesion to stoma has healed since last visit.  Stomal assessment/size:  1 1/2" well budded   Peristomal assessment:  redness to peristomal skin, comes and goes  shaves hair occasionally.  Treatment options for stomal/peristomal skin: Will add topical aerosol steroid to this area.  COntinue barrier ring and 2 piece pouch.  I give him sample bottle of nasacort.  Output: soft brown stool Ostomy pouching: 2pc. 2 3/4" Education provided:  discussed rationale for steroid. Has been using antifungal powder.     Impression/dx  Contact dermatitis to peristomal skin  possibly irritation from shaving  Does not experience frequent leaks.  Discussion  See above Plan  Call clinic as needed  will assist with supplies from [facility]     Visit time: 45 minutes.   Domenic Moras FNP-BC

## 2022-06-11 ENCOUNTER — Encounter (HOSPITAL_COMMUNITY): Payer: Self-pay | Admitting: Nurse Practitioner

## 2022-06-17 ENCOUNTER — Other Ambulatory Visit (HOSPITAL_COMMUNITY): Payer: Self-pay | Admitting: Nurse Practitioner

## 2022-06-17 DIAGNOSIS — Z433 Encounter for attention to colostomy: Secondary | ICD-10-CM

## 2022-10-13 IMAGING — MR MR PELVIS W/O CM
8 of 9 series · 38 of 48 positions shown · non-contrast
Comparison: Colonoscopy report from 10/28/2021.

CLINICAL DATA: Rectal polyps. Some removed. Residual large rectal
polyp on colonoscopy. Evaluate prior to further management.

EXAM:
MRI PELVIS WITHOUT CONTRAST
TECHNIQUE: Multiplanar multisequence MR imaging of the pelvis was performed. No
intravenous contrast was administered. Ultrasound gel was
administered per rectum to optimize tumor evaluation.

[Series 2: T2 · sagittal · 3.0mm · 1.19mm/px · 6 of 35 slices shown (1 of 3)]
[im 1/35]
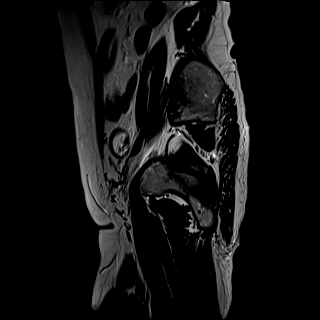
[im 7/35]
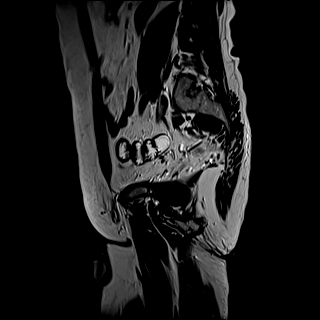
[im 14/35]
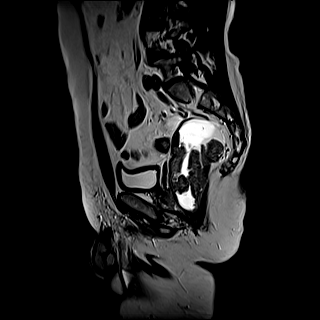
[im 21/35]
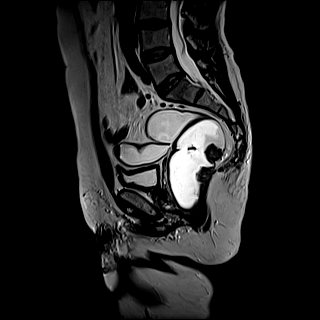
[im 28/35]
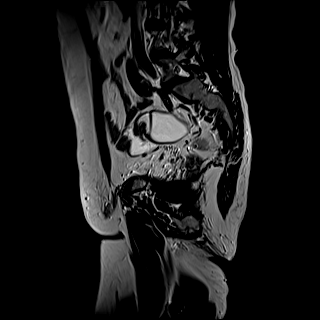
[im 35/35]
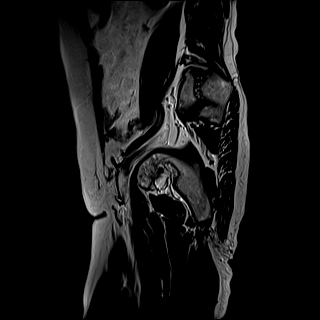

[Series 3: T2 · coronal · 3.0mm · 0.56mm/px · 6 of 45 slices shown (2 of 3)]
[im 1/45]
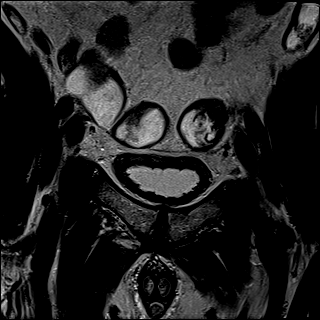
[im 9/45]
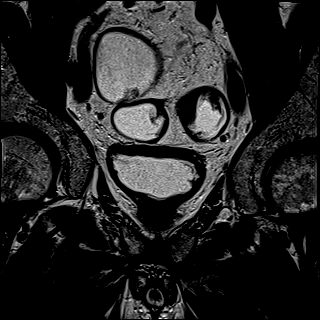
[im 18/45]
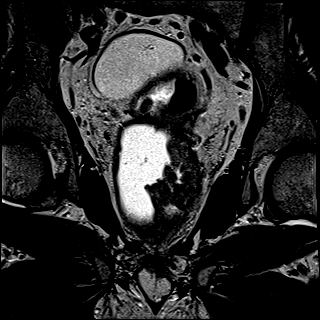
[im 27/45]
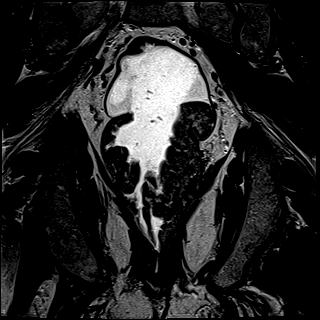
[im 36/45]
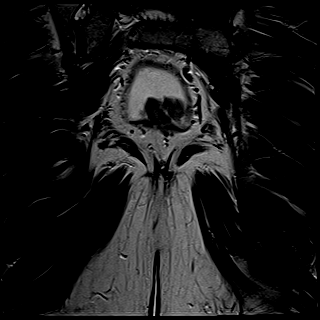
[im 45/45]
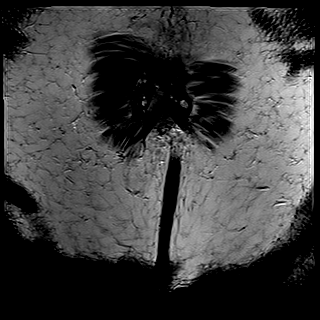

[Series 4: T2 · axial · 5.0mm · 0.59mm/px · z∈[-83,+133]mm · 5 of 37 slices shown (3 of 3)]
[im 1/37]
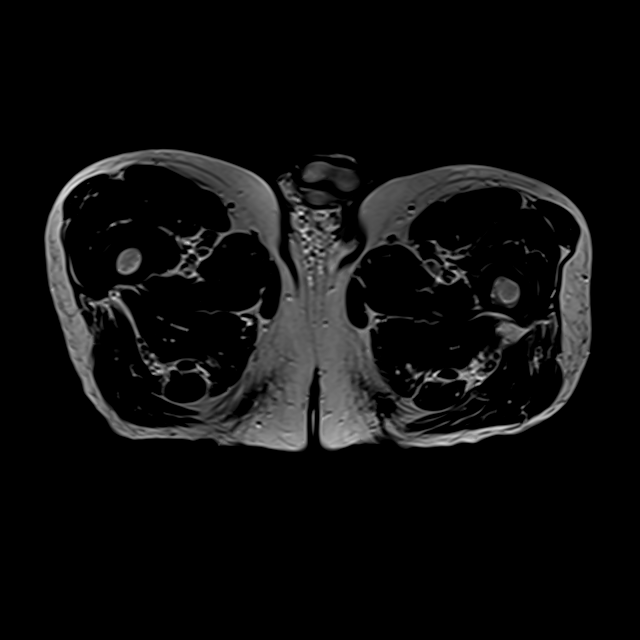
[im 10/37]
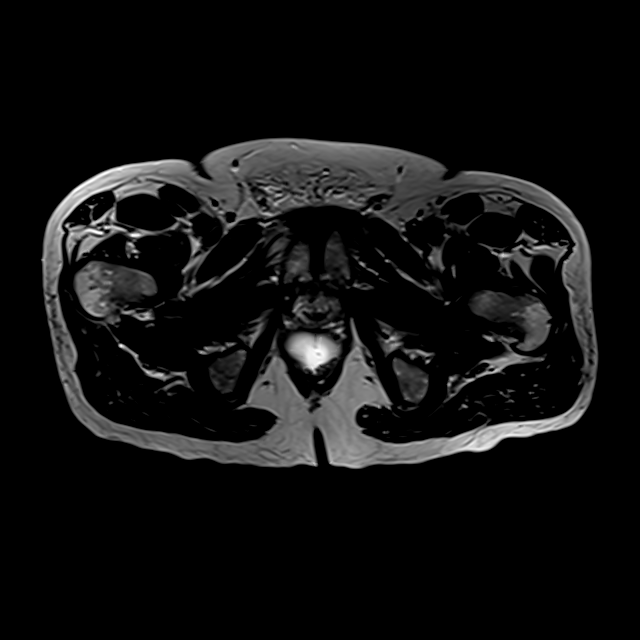
[im 19/37]
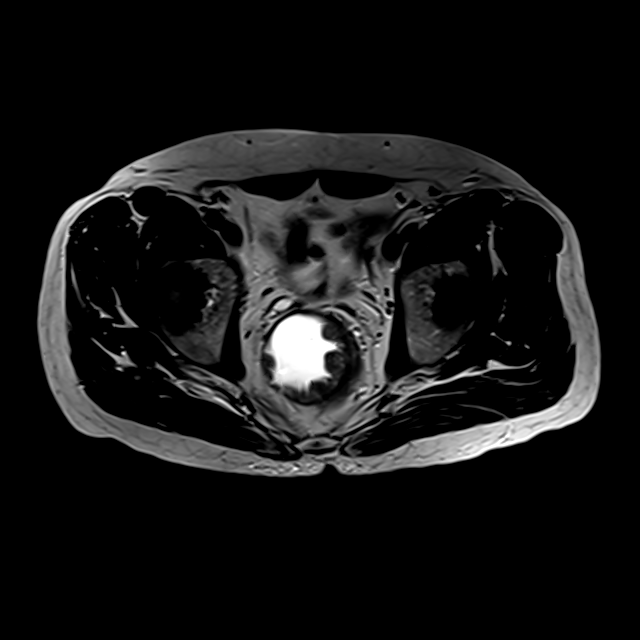
[im 28/37]
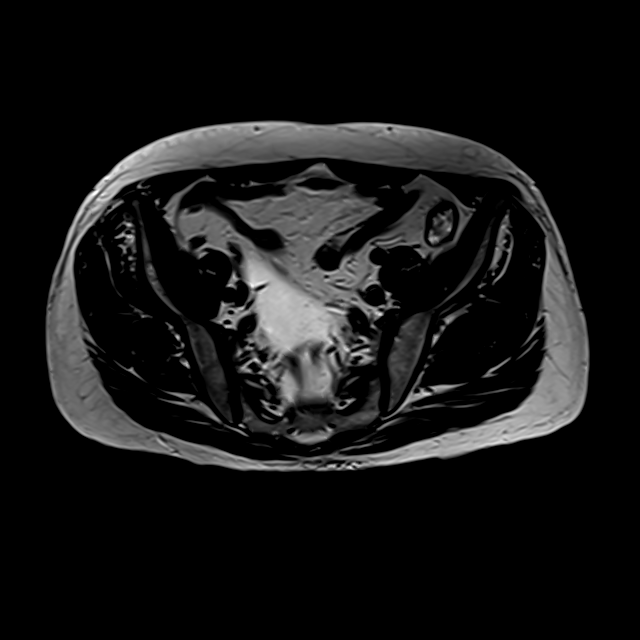
[im 37/37]
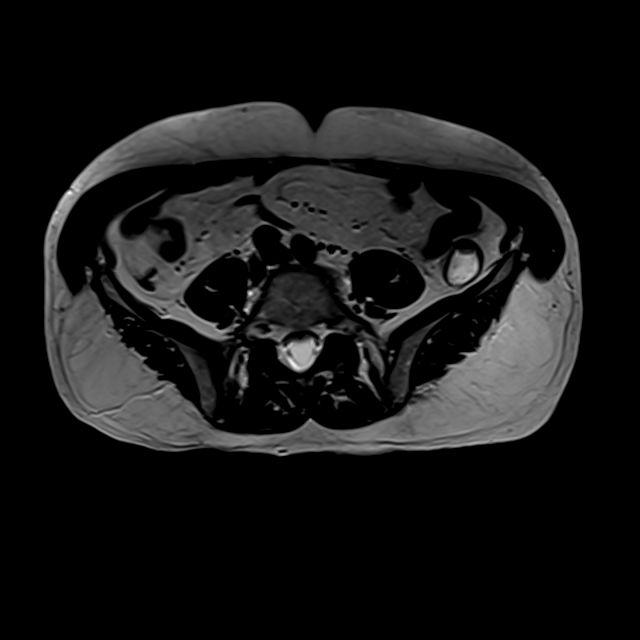

[Series 5: ax dwi_tracew · axial · 3.0mm · 1.48mm/px · z∈[-55,+61]mm · 5 of 40 slices shown (1 of 3)]
[im 1/40]
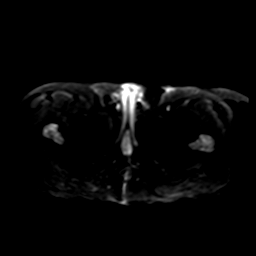
[im 10/40]
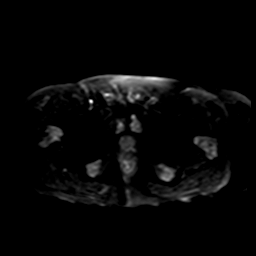
[im 20/40]
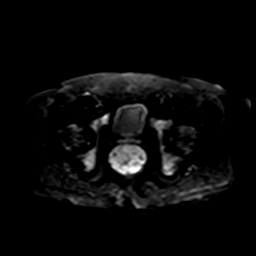
[im 30/40]
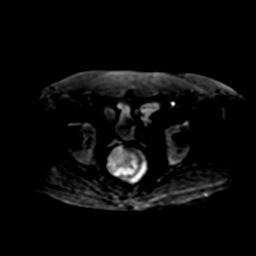
[im 40/40]
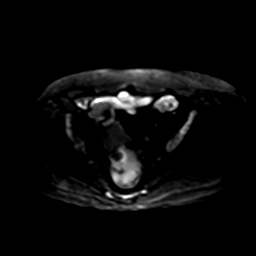

[Series 5: ax dwi_tracew · axial · 3.0mm · 1.48mm/px · z∈[-55,+61]mm · 5 of 40 slices shown (2 of 3)]
[im 1/40]
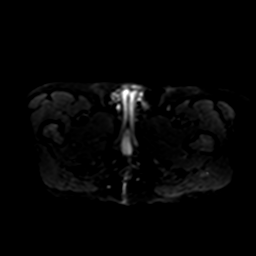
[im 10/40]
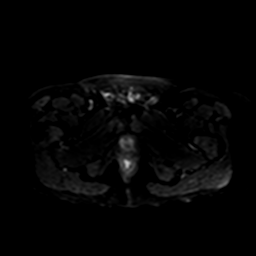
[im 20/40]
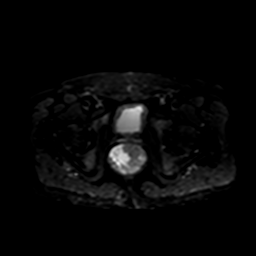
[im 30/40]
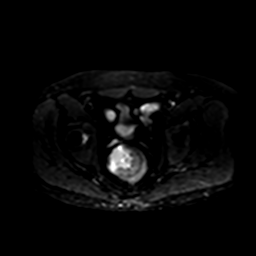
[im 40/40]
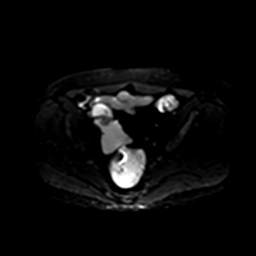

[Series 5: ax dwi_tracew · axial · 3.0mm · 1.48mm/px · z∈[-55,+61]mm · 5 of 40 slices shown (3 of 3)]
[im 1/40]
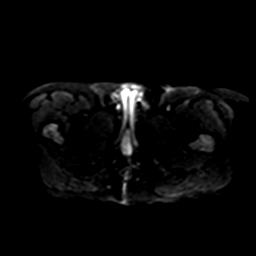
[im 10/40]
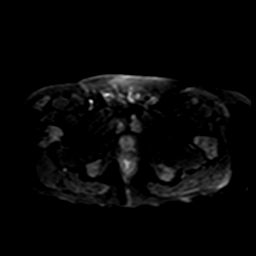
[im 20/40]
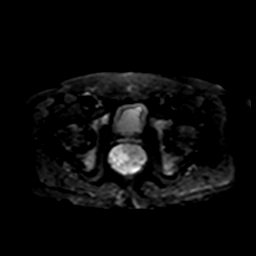
[im 30/40]
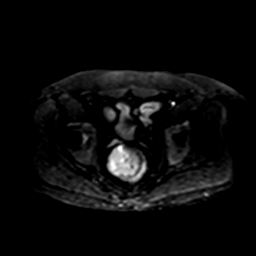
[im 40/40]
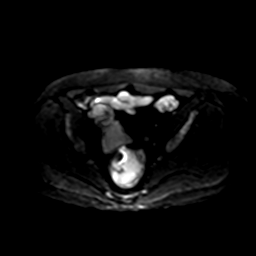

[Series 6: ax dwi_adc · axial · 3.0mm · 1.48mm/px · z∈[-55,+61]mm · 5 of 40 slices shown]
[im 1/40]
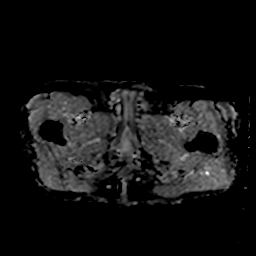
[im 10/40]
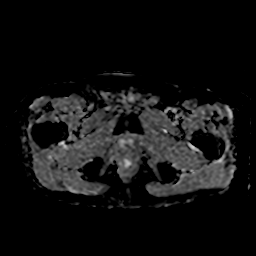
[im 20/40]
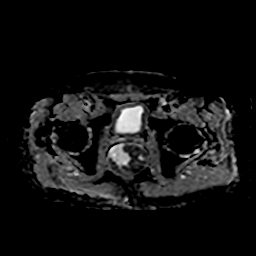
[im 30/40]
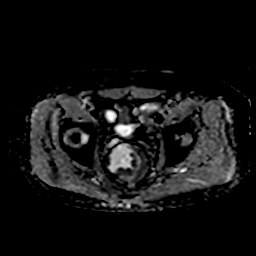
[im 40/40]
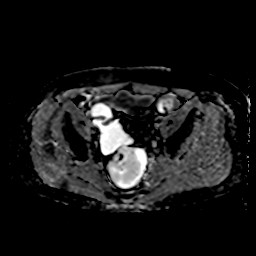

[Series 7: ax dwi_calc_bval · axial · 3.0mm · 1.48mm/px · 1 of 40 slices shown]
[im 1/40]
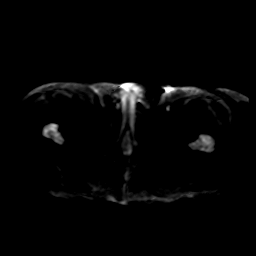

[38 of 48 positions shown; findings below may reference images not displayed]

FINDINGS: TUMOR LOCATION

Tumor distance from Anal Verge/Skin Surface: 4.9 cm on [DATE]

Tumor distance to Internal Anal Sphincter: 2.1 cm on [DATE]

TUMOR DESCRIPTION

Circumferential Extent: Centered about the 5 o'clock position,
maximally approximately 300 degrees on [DATE]

Tumor Length: 7.3 cm on [DATE]

T - CATEGORY

Extension through Muscularis Propria: No=T2-no convincing evidence
of extension through the propria. The axial high-resolution, small
field-of-view images are minimally motion degraded. Therefore, there
is intermittent ill definition of the expected T2 hypointense
propria.

Shortest Distance of any tumor/node from Mesorectal Fascia: 3 mm on
[DATE]

Extramural Vascular Invasion/Tumor Thrombus: No

Invasion of Anterior Peritoneal Reflection: No

Involvement of Adjacent Organs or Pelvic Sidewall: No

Levator Ani Involvement: No

N - CATEGORY

Mesorectal Lymph Nodes >=5mm: None=N0

Extra-mesorectal Lymphadenopathy: No

Other: Normal urinary bladder. No bowel obstruction. No free fluid.
Vague areas of heterogeneous peripheral zone T2 signal within the
prostate are nonspecific, especially given nondedicated technique.

Remote right inferior pubic ramus fracture.
IMPRESSION: Large rectal mass as detailed above. Although portions of exam are
minimally degraded by motion, no findings to confirm extension
through the muscularis propria.

No perirectal or pelvic sidewall adenopathy.

Distance from tumor to the internal anal sphincter is 2.1 cm.

Nonspecific and suboptimally evaluated heterogeneous T2 signal
within the peripheral zone of the prostate. Consider correlation
with PSA level.

## 2022-12-15 ENCOUNTER — Encounter: Payer: Self-pay | Admitting: Genetic Counselor

## 2022-12-15 NOTE — Progress Notes (Signed)
UPDATE: CDKN1B p.Q197K VUS has been amended to Likely Benign. Amended report date is 12/03/2022.

## 2023-02-19 ENCOUNTER — Encounter: Payer: Self-pay | Admitting: Internal Medicine

## 2023-02-25 ENCOUNTER — Telehealth: Payer: Self-pay | Admitting: *Deleted

## 2023-02-25 NOTE — Telephone Encounter (Signed)
Patient called in. He stated he received a letter needed to come in for visit. He stated he called down here few weeks ago and was told we couldn't do a TCS on him bc he had a colostomy bag. I don't see any notes. Dr. Jena Gauss please advise thanks

## 2023-02-27 NOTE — Telephone Encounter (Signed)
Spoke with pt. He has been scheduled with Dr. Jena Gauss 8/9.

## 2023-03-03 ENCOUNTER — Other Ambulatory Visit (HOSPITAL_COMMUNITY): Payer: Self-pay | Admitting: General Surgery

## 2023-03-03 DIAGNOSIS — C20 Malignant neoplasm of rectum: Secondary | ICD-10-CM

## 2023-03-06 ENCOUNTER — Encounter: Payer: Self-pay | Admitting: *Deleted

## 2023-03-06 ENCOUNTER — Ambulatory Visit (INDEPENDENT_AMBULATORY_CARE_PROVIDER_SITE_OTHER): Payer: 59 | Admitting: Internal Medicine

## 2023-03-06 ENCOUNTER — Encounter: Payer: Self-pay | Admitting: Internal Medicine

## 2023-03-06 VITALS — BP 129/82 | HR 98 | Temp 97.9°F | Ht 69.0 in | Wt 165.2 lb

## 2023-03-06 DIAGNOSIS — C20 Malignant neoplasm of rectum: Secondary | ICD-10-CM | POA: Diagnosis not present

## 2023-03-06 DIAGNOSIS — Z86010 Personal history of colonic polyps: Secondary | ICD-10-CM

## 2023-03-06 MED ORDER — NA SULFATE-K SULFATE-MG SULF 17.5-3.13-1.6 GM/177ML PO SOLN: ORAL | 0 refills | Status: DC

## 2023-03-06 NOTE — Patient Instructions (Signed)
It was good to see you again today!  As discussed, we will schedule a surveillance colonoscopy ASA 3 via colostomy.  History of rectal cancer APR.  History of multiple colon polyps removed throughout your colon last year.  Follow through with CTs as ordered by Dr. Maisie Fus  Further recommendations to follow.

## 2023-03-06 NOTE — Progress Notes (Unsigned)
Primary Care Physician:  Rebekah Chesterfield, NP Primary Gastroenterologist:  Dr. Jena Gauss  Pre-Procedure History & Physical: HPI:  Jason Mclaughlin is a 67 y.o. male here for follow-up of stage I rectal cancer status APR April of last year.  He has done well.  The margins were clear and his CEA is normal.  He is scheduled for follow-up CTs.  His colostomy is working very well.  He is very happy.  He is not passing any blood he has no abdominal pain; he needs a 1 year surveillance colonoscopy. Gentleman had approximately 50 adenomas/tubulovillous adenomas throughout his colon which were removed at his first colonoscopy last year.    Past Medical History:  Diagnosis Date   Arthritis    COPD (chronic obstructive pulmonary disease) (HCC)    Family history of colon cancer    Hypertension    Pneumonia    Polyposis coli    Tinnitus     Past Surgical History:  Procedure Laterality Date   BIOPSY  10/28/2021   Procedure: BIOPSY;  Surgeon: Corbin Ade, MD;  [facility];  Service: Endoscopy;;   COLONOSCOPY WITH PROPOFOL N/A 10/28/2021   Procedure: COLONOSCOPY WITH PROPOFOL;  Surgeon: Corbin Ade, MD;  Location: [facility];  Service: Endoscopy;  Laterality: N/A;  2:15pm   HOT HEMOSTASIS  10/28/2021   Procedure: HOT HEMOSTASIS (ARGON PLASMA COAGULATION/BICAP);  Surgeon: Corbin Ade, MD;  Location: [facility];  Service: Endoscopy;;   MULTIPLE TOOTH EXTRACTIONS     OSTOMY N/A 02/05/2022   Procedure: OSTOMY CREATION;  Surgeon: Romie Levee, MD;  Location: [facility];  Service: General;  Laterality: N/A;   POLYPECTOMY  10/28/2021   Procedure: POLYPECTOMY;  Surgeon: Corbin Ade, MD;  [facility];  Service: Endoscopy;;   XI ROBOTIC ASSISTED LOWER ANTERIOR RESECTION N/A 02/05/2022   Procedure: XI ROBOTIC ASSISTED ABDOMINAL PERINEAL RESECTION;  Surgeon: Romie Levee, MD;  Location: [facility];  Service: General;  Laterality: N/A;    Prior to Admission  medications   Medication Sig Start Date End Date Taking? Authorizing Provider  amLODipine (NORVASC) 10 MG tablet Take 10 mg by mouth daily. 10/03/21  Yes [provider]  bisacodyl (DULCOLAX) 5 MG EC tablet Take 5 mg by mouth 4 (four) times daily as needed for moderate constipation.   Yes [provider]  BREZTRI AEROSPHERE 160-9-4.8 MCG/ACT AERO Inhale 2 puffs into the lungs 2 (two) times daily. 10/03/21  Yes [provider]  Cyanocobalamin (VITAMIN B-12) 3000 MCG SUBL Place 3,000 Units under the tongue daily.   Yes [provider]  ferrous sulfate 324 MG TBEC Take 324 mg by mouth.   Yes [provider]  loratadine (CLARITIN) 10 MG tablet Take 10 mg by mouth daily.   Yes [provider]  Multiple Vitamins-Minerals (MULTIVITAMIN WITH MINERALS) tablet Take 1 tablet by mouth daily.   Yes [provider]  tadalafil (CIALIS) 5 MG tablet Take 5 mg by mouth daily as needed for erectile dysfunction. 10/03/21  Yes [provider]    Allergies as of 03/06/2023   (No Known Allergies)    Family History  Problem Relation Age of Onset   Lung cancer Mother    Colon cancer Father        late 32s   Brain cancer Maternal Grandmother     Social History   Socioeconomic History   Marital status: Single    Spouse name: Not on file  Number of children: Not on file   Years of education: Not on file   Highest education level: Not on file  Occupational History   Not on file  Tobacco Use   Smoking status: Every Day    Current packs/day: 1.00    Average packs/day: 1 pack/day for 54.6 years (54.6 ttl pk-yrs)    Types: Cigarettes    Start date: 1970   Smokeless tobacco: Current    Types: Snuff  Vaping Use   Vaping status: Never Used  Substance and Sexual Activity   Alcohol use: Yes    Alcohol/week: 1.0 standard drink of alcohol    Types: 1 Cans of beer per week   Drug use: Never   Sexual activity: Never  Other Topics Concern    Not on file  Social History Narrative   ** Merged History Encounter **       Social Determinants of Health   Financial Resource Strain: Not on file  Food Insecurity: Not on file  Transportation Needs: Not on file  Physical Activity: Not on file  Stress: Not on file  Social Connections: Not on file  Intimate Partner Violence: Not on file    Review of Systems: See HPI, otherwise negative ROS  Physical Exam: BP 129/82 (BP Location: Right Arm, Patient Position: Sitting, Cuff Size: Normal)   Pulse 98   Temp 97.9 F (36.6 C) (Oral)   Ht 5\' 9"  (1.753 m)   Wt 165 lb 3.2 oz (74.9 kg)   SpO2 99%   BMI 24.40 kg/m  General:   Alert,  Well-developed, well-nourished, pleasant and cooperative in NAD Neck:  Supple; no masses or thyromegaly. No significant cervical adenopathy. Lungs:  Clear throughout to auscultation.   No wheezes, crackles, or rhonchi. No acute distress. Heart:  Regular rate and rhythm; no murmurs, clicks, rubs,  or gallops. Abdomen: Nondistended.  Positive bowel sounds.  Colostomy left lower quadrant.  Nontender to palpation. Impression/Plan: 67 year old gentleman with stage I rectal cancer status post a robotic assisted APR April 2023.  Stage I disease.  He has done remarkably well.  His colon was studded with adenomas  - some advanced.  He needs a surveillance colonoscopy through colostomy now.  Recommendations:  I have offered him a surveillance colonoscopy (via colostomy) in the near future. The risks, benefits, limitations, alternatives and imponderables have been reviewed with the patient. Questions have been answered. All parties are agreeable.    Patient encouraged to follow through on his CT scans which are planned for the near future.  Further recommendations to follow.   Notice: This dictation was prepared with Dragon dictation along with smaller phrase technology. Any transcriptional errors that result from this process are unintentional and may not be  corrected upon review.

## 2023-03-06 NOTE — H&P (View-Only) (Signed)
 Primary Care Physician:  Rebekah Chesterfield, NP Primary Gastroenterologist:  Dr. Jena Gauss  Pre-Procedure History & Physical: HPI:  Jason Mclaughlin is a 67 y.o. male here for follow-up of stage I rectal cancer status APR April of last year.  He has done well.  The margins were clear and his CEA is normal.  He is scheduled for follow-up CTs.  His colostomy is working very well.  He is very happy.  He is not passing any blood he has no abdominal pain; he needs a 1 year surveillance colonoscopy. Gentleman had approximately 50 adenomas/tubulovillous adenomas throughout his colon which were removed at his first colonoscopy last year.    Past Medical History:  Diagnosis Date   Arthritis    COPD (chronic obstructive pulmonary disease) (HCC)    Family history of colon cancer    Hypertension    Pneumonia    Polyposis coli    Tinnitus     Past Surgical History:  Procedure Laterality Date   BIOPSY  10/28/2021   Procedure: BIOPSY;  Surgeon: Corbin Ade, MD;  Location: [facility];  Service: Endoscopy;;   COLONOSCOPY WITH PROPOFOL N/A 10/28/2021   Procedure: COLONOSCOPY WITH PROPOFOL;  Surgeon: Corbin Ade, MD;  Location: [facility];  Service: Endoscopy;  Laterality: N/A;  2:15pm   HOT HEMOSTASIS  10/28/2021   Procedure: HOT HEMOSTASIS (ARGON PLASMA COAGULATION/BICAP);  Surgeon: Corbin Ade, MD;  Location: [facility];  Service: Endoscopy;;   MULTIPLE TOOTH EXTRACTIONS     OSTOMY N/A 02/05/2022   Procedure: OSTOMY CREATION;  Surgeon: Romie Levee, MD;  Location: [facility];  Service: General;  Laterality: N/A;   POLYPECTOMY  10/28/2021   Procedure: POLYPECTOMY;  Surgeon: Corbin Ade, MD;  Location: [facility];  Service: Endoscopy;;   XI ROBOTIC ASSISTED LOWER ANTERIOR RESECTION N/A 02/05/2022   Procedure: XI ROBOTIC ASSISTED ABDOMINAL PERINEAL RESECTION;  Surgeon: Romie Levee, MD;  Location: [facility];  Service: General;  Laterality: N/A;    Prior to Admission  medications   Medication Sig Start Date End Date Taking? Authorizing Provider  amLODipine (NORVASC) 10 MG tablet Take 10 mg by mouth daily. 10/03/21  Yes [provider]  bisacodyl (DULCOLAX) 5 MG EC tablet Take 5 mg by mouth 4 (four) times daily as needed for moderate constipation.   Yes [provider]  BREZTRI AEROSPHERE 160-9-4.8 MCG/ACT AERO Inhale 2 puffs into the lungs 2 (two) times daily. 10/03/21  Yes [provider]  Cyanocobalamin (VITAMIN B-12) 3000 MCG SUBL Place 3,000 Units under the tongue daily.   Yes [provider]  ferrous sulfate 324 MG TBEC Take 324 mg by mouth.   Yes [provider]  loratadine (CLARITIN) 10 MG tablet Take 10 mg by mouth daily.   Yes [provider]  Multiple Vitamins-Minerals (MULTIVITAMIN WITH MINERALS) tablet Take 1 tablet by mouth daily.   Yes [provider]  tadalafil (CIALIS) 5 MG tablet Take 5 mg by mouth daily as needed for erectile dysfunction. 10/03/21  Yes [provider]    Allergies as of 03/06/2023   (No Known Allergies)    Family History  Problem Relation Age of Onset   Lung cancer Mother    Colon cancer Father        late 32s   Brain cancer Maternal Grandmother     Social History   Socioeconomic History   Marital status: Single    Spouse name: Not on file  Number of children: Not on file   Years of education: Not on file   Highest education level: Not on file  Occupational History   Not on file  Tobacco Use   Smoking status: Every Day    Current packs/day: 1.00    Average packs/day: 1 pack/day for 54.6 years (54.6 ttl pk-yrs)    Types: Cigarettes    Start date: 1970   Smokeless tobacco: Current    Types: Snuff  Vaping Use   Vaping status: Never Used  Substance and Sexual Activity   Alcohol use: Yes    Alcohol/week: 1.0 standard drink of alcohol    Types: 1 Cans of beer per week   Drug use: Never   Sexual activity: Never  Other Topics Concern    Not on file  Social History Narrative   ** Merged History Encounter **       Social Determinants of Health   Financial Resource Strain: Not on file  Food Insecurity: Not on file  Transportation Needs: Not on file  Physical Activity: Not on file  Stress: Not on file  Social Connections: Not on file  Intimate Partner Violence: Not on file    Review of Systems: See HPI, otherwise negative ROS  Physical Exam: BP 129/82 (BP Location: Right Arm, Patient Position: Sitting, Cuff Size: Normal)   Pulse 98   Temp 97.9 F (36.6 C) (Oral)   Ht 5\' 9"  (1.753 m)   Wt 165 lb 3.2 oz (74.9 kg)   SpO2 99%   BMI 24.40 kg/m  General:   Alert,  Well-developed, well-nourished, pleasant and cooperative in NAD Neck:  Supple; no masses or thyromegaly. No significant cervical adenopathy. Lungs:  Clear throughout to auscultation.   No wheezes, crackles, or rhonchi. No acute distress. Heart:  Regular rate and rhythm; no murmurs, clicks, rubs,  or gallops. Abdomen: Nondistended.  Positive bowel sounds.  Colostomy left lower quadrant.  Nontender to palpation. Impression/Plan: 67 year old gentleman with stage I rectal cancer status post a robotic assisted APR April 2023.  Stage I disease.  He has done remarkably well.  His colon was studded with adenomas  - some advanced.  He needs a surveillance colonoscopy through colostomy now.  Recommendations:  I have offered him a surveillance colonoscopy (via colostomy) in the near future. The risks, benefits, limitations, alternatives and imponderables have been reviewed with the patient. Questions have been answered. All parties are agreeable.    Patient encouraged to follow through on his CT scans which are planned for the near future.  Further recommendations to follow.   Notice: This dictation was prepared with Dragon dictation along with smaller phrase technology. Any transcriptional errors that result from this process are unintentional and may not be  corrected upon review.

## 2023-03-12 ENCOUNTER — Other Ambulatory Visit (HOSPITAL_COMMUNITY): Payer: 59

## 2023-03-16 ENCOUNTER — Ambulatory Visit (HOSPITAL_COMMUNITY)
Admission: RE | Admit: 2023-03-16 | Discharge: 2023-03-16 | Disposition: A | Discharge status: Home / Self Care | Payer: 59 | Source: Ambulatory Visit | Attending: General Surgery | Admitting: General Surgery

## 2023-03-16 DIAGNOSIS — C20 Malignant neoplasm of rectum: Secondary | ICD-10-CM

## 2023-03-16 NOTE — Patient Instructions (Signed)
RIELLY IMRAN  03/16/2023     @PREFPERIOPPHARMACY @   Your procedure is scheduled on  03/18/2023.   [facility] at  [phone]  A.M.   Call this number if you have problems the morning of surgery:  [phone]  If you experience any cold or flu symptoms such as cough, fever, chills, shortness of breath, etc. between now and your scheduled surgery, please notify us at the above number.   Remember:  Follow the diet and prep instructions given to you by the office.      Take these medicines the morning of surgery with A SIP OF WATER                                            Amlodipine, claritin.     Do not wear jewelry, make-up or nail polish, including gel polish,  artificial nails, or any other type of covering on natural nails (fingers and  toes).  Do not wear lotions, powders, or perfumes, or deodorant.  Do not shave 48 hours prior to surgery.  Men may shave face and neck.  Do not bring valuables to the hospital.  [facility] is not responsible for any belongings or valuables.  Contacts, dentures or bridgework may not be worn into surgery.  Leave your suitcase in the car.  After surgery it may be brought to your room.  For patients admitted to the hospital, discharge time will be determined by your treatment team.  Patients discharged the day of surgery will not be allowed to drive home and must have someone with them for 24 hours.    Special instructions:   DO NOT smoke tobacco or vape for 24 hours before your procedure.  Please read over the following fact sheets that you were given. Anesthesia Post-op Instructions and Care and Recovery After Surgery      Colonoscopy, Adult, Care After The following information offers guidance on how to care for yourself after your procedure. Your health care provider may also give you more specific instructions. If you have problems or questions, contact your health care provider. What can I expect after the  procedure? After the procedure, it is common to have: A small amount of blood in your stool for 24 hours after the procedure. Some gas. Mild cramping or bloating of your abdomen. Follow these instructions at home: Eating and drinking  Drink enough fluid to keep your urine pale yellow. Follow instructions from your health care provider about eating or drinking restrictions. Resume your normal diet as told by your health care provider. Avoid heavy or fried foods that are hard to digest. Activity Rest as told by your health care provider. Avoid sitting for a long time without moving. Get up to take short walks every 1-2 hours. This is important to improve blood flow and breathing. Ask for help if you feel weak or unsteady. Return to your normal activities as told by your health care provider. Ask your health care provider what activities are safe for you. Managing cramping and bloating  Try walking around when you have cramps or feel bloated. If directed, apply heat to your abdomen as told by your health care provider. Use the heat source that your health care provider recommends, such as a moist heat pack or a heating pad. Place a towel between your skin  and the heat source. Leave the heat on for 20-30 minutes. Remove the heat if your skin turns bright red. This is especially important if you are unable to feel pain, heat, or cold. You have a greater risk of getting burned. General instructions If you were given a sedative during the procedure, it can affect you for several hours. Do not drive or operate machinery until your health care provider says that it is safe. For the first 24 hours after the procedure: Do not sign important documents. Do not drink alcohol. Do your regular daily activities at a slower pace than normal. Eat soft foods that are easy to digest. Take over-the-counter and prescription medicines only as told by your health care provider. Keep all follow-up visits. This  is important. Contact a health care provider if: You have blood in your stool 2-3 days after the procedure. Get help right away if: You have more than a small spotting of blood in your stool. You have large blood clots in your stool. You have swelling of your abdomen. You have nausea or vomiting. You have a fever. You have increasing pain in your abdomen that is not relieved with medicine. These symptoms may be an emergency. Get help right away. Call 911. Do not wait to see if the symptoms will go away. Do not drive yourself to the hospital. Summary After the procedure, it is common to have a small amount of blood in your stool. You may also have mild cramping and bloating of your abdomen. If you were given a sedative during the procedure, it can affect you for several hours. Do not drive or operate machinery until your health care provider says that it is safe. Get help right away if you have a lot of blood in your stool, nausea or vomiting, a fever, or increased pain in your abdomen. This information is not intended to replace advice given to you by your health care provider. Make sure you discuss any questions you have with your health care provider. Document Revised: 08/26/2022 Document Reviewed: 03/06/2021 Elsevier Patient Education  2024 [employer] Monitored Anesthesia Care, Care After The following information offers guidance on how to care for yourself after your procedure. Your health care provider may also give you more specific instructions. If you have problems or questions, contact your health care provider. What can I expect after the procedure? After the procedure, it is common to have: Tiredness. Little or no memory about what happened during or after the procedure. Impaired judgment when it comes to making decisions. Nausea or vomiting. Some trouble with balance. Follow these instructions at home: For the time period you were told by your health care  provider:  Rest. Do not participate in activities where you could fall or become injured. Do not drive or use machinery. Do not drink alcohol. Do not take sleeping pills or medicines that cause drowsiness. Do not make important decisions or sign legal documents. Do not take care of children on your own. Medicines Take over-the-counter and prescription medicines only as told by your health care provider. If you were prescribed antibiotics, take them as told by your health care provider. Do not stop using the antibiotic even if you start to feel better. Eating and drinking Follow instructions from your health care provider about what you may eat and drink. Drink enough fluid to keep your urine pale yellow. If you vomit: Drink clear fluids slowly and in small amounts as you are able. Clear fluids include water,  ice chips, low-calorie sports drinks, and fruit juice that has water added to it (diluted fruit juice). Eat light and bland foods in small amounts as you are able. These foods include bananas, applesauce, rice, lean meats, toast, and crackers. General instructions  Have a responsible adult stay with you for the time you are told. It is important to have someone help care for you until you are awake and alert. If you have sleep apnea, surgery and some medicines can increase your risk for breathing problems. Follow instructions from your health care provider about wearing your sleep device: When you are sleeping. This includes during daytime naps. While taking prescription pain medicines, sleeping medicines, or medicines that make you drowsy. Do not use any products that contain nicotine or tobacco. These products include cigarettes, chewing tobacco, and vaping devices, such as e-cigarettes. If you need help quitting, ask your health care provider. Contact a health care provider if: You feel nauseous or vomit every time you eat or drink. You feel light-headed. You are still sleepy or  having trouble with balance after 24 hours. You get a rash. You have a fever. You have redness or swelling around the IV site. Get help right away if: You have trouble breathing. You have new confusion after you get home. These symptoms may be an emergency. Get help right away. Call 911. Do not wait to see if the symptoms will go away. Do not drive yourself to the hospital. This information is not intended to replace advice given to you by your health care provider. Make sure you discuss any questions you have with your health care provider. Document Revised: 12/09/2021 Document Reviewed: 12/09/2021 Elsevier Patient Education  2024 [employer].

## 2023-03-17 ENCOUNTER — Encounter (HOSPITAL_COMMUNITY)
Admission: RE | Admit: 2023-03-17 | Discharge: 2023-03-17 | Disposition: A | Discharge status: Home / Self Care | Payer: 59 | Source: Ambulatory Visit | Attending: Internal Medicine | Admitting: Internal Medicine

## 2023-03-17 ENCOUNTER — Encounter (HOSPITAL_COMMUNITY): Payer: Self-pay

## 2023-03-17 VITALS — BP 129/82 | HR 98 | Temp 97.9°F | Resp 18 | Ht 69.0 in | Wt 165.2 lb

## 2023-03-17 DIAGNOSIS — D649 Anemia, unspecified: Secondary | ICD-10-CM | POA: Diagnosis not present

## 2023-03-17 DIAGNOSIS — I10 Essential (primary) hypertension: Secondary | ICD-10-CM | POA: Diagnosis not present

## 2023-03-17 DIAGNOSIS — Z01818 Encounter for other preprocedural examination: Secondary | ICD-10-CM | POA: Diagnosis present

## 2023-03-17 LAB — CBC WITH DIFFERENTIAL/PLATELET
Abs Immature Granulocytes: 0.01 10*3/uL (ref 0.00–0.07)
Basophils Absolute: 0 10*3/uL (ref 0.0–0.1)
Basophils Relative: 0 %
Eosinophils Absolute: 0.1 10*3/uL (ref 0.0–0.5)
Eosinophils Relative: 1 %
HCT: 51.7 % (ref 39.0–52.0)
Hemoglobin: 17 g/dL (ref 13.0–17.0)
Immature Granulocytes: 0 %
Lymphocytes Relative: 30 %
Lymphs Abs: 1.7 10*3/uL (ref 0.7–4.0)
MCH: 28.9 pg (ref 26.0–34.0)
MCHC: 32.9 g/dL (ref 30.0–36.0)
MCV: 87.8 fL (ref 80.0–100.0)
Monocytes Absolute: 0.5 10*3/uL (ref 0.1–1.0)
Monocytes Relative: 9 %
Neutro Abs: 3.4 10*3/uL (ref 1.7–7.7)
Neutrophils Relative %: 60 %
Platelets: 228 10*3/uL (ref 150–400)
RBC: 5.89 MIL/uL — ABNORMAL HIGH (ref 4.22–5.81)
RDW: 17.1 % — ABNORMAL HIGH (ref 11.5–15.5)
WBC: 5.7 10*3/uL (ref 4.0–10.5)
nRBC: 0 % (ref 0.0–0.2)

## 2023-03-18 ENCOUNTER — Ambulatory Visit (HOSPITAL_COMMUNITY)
Admission: RE | Admit: 2023-03-18 | Discharge: 2023-03-18 | Disposition: A | Discharge status: Home / Self Care | Payer: 59 | Attending: Internal Medicine | Admitting: Internal Medicine

## 2023-03-18 ENCOUNTER — Encounter (HOSPITAL_COMMUNITY)
Admission: RE | Disposition: A | Discharge status: Home / Self Care | Payer: Self-pay | Source: Home / Self Care | Attending: Internal Medicine

## 2023-03-18 ENCOUNTER — Encounter (HOSPITAL_COMMUNITY): Payer: Self-pay | Admitting: Internal Medicine

## 2023-03-18 ENCOUNTER — Ambulatory Visit (HOSPITAL_COMMUNITY): Payer: 59 | Admitting: Certified Registered Nurse Anesthetist

## 2023-03-18 ENCOUNTER — Ambulatory Visit (HOSPITAL_BASED_OUTPATIENT_CLINIC_OR_DEPARTMENT_OTHER): Payer: 59 | Admitting: Certified Registered Nurse Anesthetist

## 2023-03-18 DIAGNOSIS — F17210 Nicotine dependence, cigarettes, uncomplicated: Secondary | ICD-10-CM

## 2023-03-18 DIAGNOSIS — K5790 Diverticulosis of intestine, part unspecified, without perforation or abscess without bleeding: Secondary | ICD-10-CM

## 2023-03-18 DIAGNOSIS — D123 Benign neoplasm of transverse colon: Secondary | ICD-10-CM | POA: Diagnosis not present

## 2023-03-18 DIAGNOSIS — I10 Essential (primary) hypertension: Secondary | ICD-10-CM

## 2023-03-18 DIAGNOSIS — D126 Benign neoplasm of colon, unspecified: Secondary | ICD-10-CM | POA: Diagnosis not present

## 2023-03-18 DIAGNOSIS — D124 Benign neoplasm of descending colon: Secondary | ICD-10-CM | POA: Insufficient documentation

## 2023-03-18 DIAGNOSIS — D122 Benign neoplasm of ascending colon: Secondary | ICD-10-CM | POA: Insufficient documentation

## 2023-03-18 DIAGNOSIS — Z933 Colostomy status: Secondary | ICD-10-CM | POA: Diagnosis not present

## 2023-03-18 DIAGNOSIS — J449 Chronic obstructive pulmonary disease, unspecified: Secondary | ICD-10-CM | POA: Diagnosis not present

## 2023-03-18 DIAGNOSIS — D120 Benign neoplasm of cecum: Secondary | ICD-10-CM | POA: Diagnosis not present

## 2023-03-18 DIAGNOSIS — K6289 Other specified diseases of anus and rectum: Secondary | ICD-10-CM

## 2023-03-18 DIAGNOSIS — K5730 Diverticulosis of large intestine without perforation or abscess without bleeding: Secondary | ICD-10-CM | POA: Insufficient documentation

## 2023-03-18 DIAGNOSIS — Z85048 Personal history of other malignant neoplasm of rectum, rectosigmoid junction, and anus: Secondary | ICD-10-CM | POA: Diagnosis not present

## 2023-03-18 DIAGNOSIS — Z08 Encounter for follow-up examination after completed treatment for malignant neoplasm: Secondary | ICD-10-CM | POA: Insufficient documentation

## 2023-03-18 HISTORY — PX: COLONOSCOPY WITH PROPOFOL: SHX5780

## 2023-03-18 HISTORY — PX: POLYPECTOMY: SHX149

## 2023-03-18 SURGERY — COLONOSCOPY WITH PROPOFOL
Anesthesia: General

## 2023-03-18 MED ORDER — PROPOFOL 10 MG/ML IV BOLUS
INTRAVENOUS | Status: DC | PRN
  Administered 2023-03-18: 60 mg via INTRAVENOUS

## 2023-03-18 MED ORDER — PHENYLEPHRINE HCL (PRESSORS) 10 MG/ML IV SOLN
INTRAVENOUS | Status: DC | PRN
Start: 2023-03-18 — End: 2023-03-18
  Administered 2023-03-18 (×4): 80 ug via INTRAVENOUS

## 2023-03-18 MED ORDER — PROPOFOL 500 MG/50ML IV EMUL
INTRAVENOUS | Status: DC | PRN
  Administered 2023-03-18: 175 ug/kg/min via INTRAVENOUS

## 2023-03-18 MED ORDER — GLUCAGON HCL RDNA (DIAGNOSTIC) 1 MG IJ SOLR
INTRAMUSCULAR | Status: DC | PRN
Start: 2023-03-18 — End: 2023-03-18
  Administered 2023-03-18 (×3): .5 mg via INTRAVENOUS

## 2023-03-18 MED ORDER — PROPOFOL 500 MG/50ML IV EMUL
INTRAVENOUS | Status: AC
  Filled 2023-03-18: qty 50

## 2023-03-18 MED ORDER — LACTATED RINGERS IV SOLN: INTRAVENOUS | Status: DC

## 2023-03-18 MED ORDER — PROPOFOL 1000 MG/100ML IV EMUL
INTRAVENOUS | Status: AC
  Filled 2023-03-18: qty 100

## 2023-03-18 MED ORDER — GLUCAGON HCL RDNA (DIAGNOSTIC) 1 MG IJ SOLR
INTRAMUSCULAR | Status: AC
  Filled 2023-03-18: qty 1

## 2023-03-18 MED ORDER — STERILE WATER FOR IRRIGATION IR SOLN
Status: DC | PRN
  Administered 2023-03-18: 60 mL

## 2023-03-18 MED ORDER — PHENYLEPHRINE 80 MCG/ML (10ML) SYRINGE FOR IV PUSH (FOR BLOOD PRESSURE SUPPORT)
PREFILLED_SYRINGE | INTRAVENOUS | Status: AC
  Filled 2023-03-18: qty 10

## 2023-03-18 NOTE — Transfer of Care (Signed)
Immediate Anesthesia Transfer of Care Note  Patient: Jason Mclaughlin  Procedure(s) Performed: COLONOSCOPY WITH PROPOFOL POLYPECTOMY INTESTINAL  Patient Location: Short Stay  Anesthesia Type:General  Level of Consciousness: awake, alert , and oriented  Airway & Oxygen Therapy: Patient Spontanous Breathing  Post-op Assessment: Report given to RN, Post -op Vital signs reviewed and stable, Patient moving all extremities X 4, and Patient able to stick tongue midline  Post vital signs: Reviewed and stable  Last Vitals:  Vitals Value Taken Time  BP 109/68   Temp 96.8   Pulse 73   Resp 23   SpO2 94     Last Pain:  Vitals:   03/18/23 0810  PainSc: 0-No pain         Complications: No notable events documented.

## 2023-03-18 NOTE — Discharge Instructions (Signed)
  Colonoscopy Discharge Instructions  Read the instructions outlined below and refer to this sheet in the next few weeks. These discharge instructions provide you with general information on caring for yourself after you leave the hospital. Your doctor may also give you specific instructions. While your treatment has been planned according to the most current medical practices available, unavoidable complications occasionally occur. If you have any problems or questions after discharge, call Dr. Jena Gauss at [phone]. ACTIVITY You may resume your regular activity, but move at a slower pace for the next 24 hours.  Take frequent rest periods for the next 24 hours.  Walking will help get rid of the air and reduce the bloated feeling in your belly (abdomen).  No driving for 24 hours (because of the medicine (anesthesia) used during the test).   Do not sign any important legal documents or operate any machinery for 24 hours (because of the anesthesia used during the test).  NUTRITION Drink plenty of fluids.  You may resume your normal diet as instructed by your doctor.  Begin with a light meal and progress to your normal diet. Heavy or fried foods are harder to digest and may make you feel sick to your stomach (nauseated).  Avoid alcoholic beverages for 24 hours or as instructed.  MEDICATIONS You may resume your normal medications unless your doctor tells you otherwise.  WHAT YOU CAN EXPECT TODAY Some feelings of bloating in the abdomen.  Passage of more gas than usual.  Spotting of blood in your stool or on the toilet paper.  IF YOU HAD POLYPS REMOVED DURING THE COLONOSCOPY: No aspirin products for 7 days or as instructed.  No alcohol for 7 days or as instructed.  Eat a soft diet for the next 24 hours.  FINDING OUT THE RESULTS OF YOUR TEST Not all test results are available during your visit. If your test results are not back during the visit, make an appointment with your caregiver to find out the  results. Do not assume everything is normal if you have not heard from your caregiver or the medical facility. It is important for you to follow up on all of your test results.  SEEK IMMEDIATE MEDICAL ATTENTION IF: You have more than a spotting of blood in your stool.  Your belly is swollen (abdominal distention).  You are nauseated or vomiting.  You have a temperature over 101.  You have abdominal pain or discomfort that is severe or gets worse throughout the day.     Multiple polyps found in your colon today.  They were all small.  Multiple polyps removed.  Diverticulosis and colon polyp information provided  You may see a small amount of blood over the next 24 hours coming through your ostomy.  However, should taper off.    Further recommendations to follow pending review of pathology report   at patient request, I called Jason Mclaughlin at [phone] -  discussed findings.

## 2023-03-18 NOTE — Anesthesia Preprocedure Evaluation (Signed)
Anesthesia Evaluation  Patient identified by MRN, date of birth, ID band Patient awake    Reviewed: Allergy & Precautions, H&P , NPO status , Patient's Chart, lab work & pertinent test results, reviewed documented beta blocker date and time   Airway Mallampati: II  TM Distance: >3 FB Neck ROM: full    Dental no notable dental hx.    Pulmonary neg pulmonary ROS, pneumonia, COPD, Current Smoker   Pulmonary exam normal breath sounds clear to auscultation       Cardiovascular Exercise Tolerance: Good hypertension, negative cardio ROS  Rhythm:regular Rate:Normal     Neuro/Psych negative neurological ROS  negative psych ROS   GI/Hepatic negative GI ROS, Neg liver ROS,,,  Endo/Other  negative endocrine ROS    Renal/GU negative Renal ROS  negative genitourinary   Musculoskeletal   Abdominal   Peds  Hematology negative hematology ROS (+)   Anesthesia Other Findings   Reproductive/Obstetrics negative OB ROS                             Anesthesia Physical Anesthesia Plan  ASA: 3  Anesthesia Plan: General   Post-op Pain Management:    Induction:   PONV Risk Score and Plan: Propofol infusion  Airway Management Planned:   Additional Equipment:   Intra-op Plan:   Post-operative Plan:   Informed Consent: I have reviewed the patients History and Physical, chart, labs and discussed the procedure including the risks, benefits and alternatives for the proposed anesthesia with the patient or authorized representative who has indicated his/her understanding and acceptance.     Dental Advisory Given  Plan Discussed with: CRNA  Anesthesia Plan Comments:        Anesthesia Quick Evaluation

## 2023-03-18 NOTE — Op Note (Addendum)
[facility] Patient Name: Jason Mclaughlin Procedure Date: 03/18/2023 7:53 AM MRN: [id number] Date of Birth: 19-Feb-1956 Attending MD: Gennette Pac , MD, [id number] CSN: [id number] Age: 67 Admit Type: Outpatient Procedure:                Colonoscopy through colostomy Indications:              High risk colon cancer surveillance: Personal                            history of colon cancer (rectal cancwer Stage 1 -                            S/P APR) Providers:                Gennette Pac, MD, Crystal Page, Dyann Ruddle, Elinor Parkinson Referring MD:              Medicines:                Propofol per Anesthesia Complications:            No immediate complications. Estimated Blood Loss:     Estimated blood loss was minimal. Procedure:                Pre-Anesthesia Assessment:                           - Prior to the procedure, a History and Physical                            was performed, and patient medications and                            allergies were reviewed. The patient's tolerance of                            previous anesthesia was also reviewed. The risks                            and benefits of the procedure and the sedation                            options and risks were discussed with the patient.                            All questions were answered, and informed consent                            was obtained. Prior Anticoagulants: The patient has                            taken no anticoagulant or antiplatelet agents. ASA  Grade Assessment: III - A patient with severe                            systemic disease. After reviewing the risks and                            benefits, the patient was deemed in satisfactory                            condition to undergo the procedure.                           After obtaining informed consent, the colonoscope                            was  passed under direct vision through colostomy.                            Throughout the procedure, the patient's blood                            pressure, pulse, and oxygen saturations were                            monitored continuously. The [id number])                            scope was introduced through the Colostomy and                            advanced to the the cecum, identified by                            appendiceal orifice and ileocecal valve. The                            colonoscopy was performed without difficulty. The                            patient tolerated the procedure well. The quality                            of the bowel preparation was adequate. Anatomical                            landmarks were photographed. The entire colon was                            well visualized. Scope In: 8:15:00 AM Scope Out: 9:07:01 AM Scope Withdrawal Time: 0 hours 48 minutes 42 seconds  Total Procedure Duration: 0 hours 52 minutes 1 second  Findings:      Patient had multiple colonic polyps from his descending colon to the       cecum. Anywhere from 3 mm to 8 mm a appeared to be  adenomatous many were       flat. Specifically in the cecum there was 1 which was cold snare removed       in the ascending segment they were 13; transverse colon 4?all cold snare       removed in the descending segment there were 12 8 were cold snared 4       larger polyps were hot snare removed. Scattered distal diverticula found. Impression:               - Colonoscopy through colostomy. Patient had                            multiple polyps (approximately 30 in the cecum,                            ascending segment, descending segment transverse                            segment.                           Polyps removed with hot or cold snare polypectomy.                            Patient had over 50 polyps removed prior to his                            APR. It is impressive  this patient had this many                            additional polyps postoperatively. Distal                            diverticula. Moderate Sedation:      Moderate (conscious) sedation was personally administered by an       anesthesia professional. The following parameters were monitored: oxygen       saturation, heart rate, blood pressure, respiratory rate, EKG, adequacy       of pulmonary ventilation, and response to care. Recommendation:           - Patient has a contact number available for                            emergencies. The signs and symptoms of potential                            delayed complications were discussed with the                            patient. Return to normal activities tomorrow.                            Written discharge instructions were provided to the                            patient.                           -  Advance diet as tolerated. Follow-up on                            pathology. Further recommendations to follow.                           - Continue present medications.                           - Repeat colonoscopy date to be determined after                            pending pathology results are reviewed for                            surveillance.                           - Return to GI office (date not yet determined). Procedure Code(s):        --- Professional ---                           [phone], Colonoscopy, flexible; diagnostic, including                            collection of specimen(s) by brushing or washing,                            when performed (separate procedure) Diagnosis Code(s):        --- Professional ---                           C16.606, Personal history of other malignant                            neoplasm of large intestine CPT copyright 2022 American [employer] Association. All rights reserved. The codes documented in this report are preliminary and upon coder review may  be revised to meet current  compliance requirements. [name], MD Gennette Pac, MD 03/18/2023 9:20:15 AM This report has been signed electronically. Number of Addenda: 0

## 2023-03-18 NOTE — Progress Notes (Signed)
  March 18, 2023  Patient: Jason Mclaughlin  Date of Birth: Mar 05, 1956  Date of Visit: 03/06/2023    To Whom It May Concern:  Jason Mclaughlin was seen and treated in our short stay department  on 03/06/2023. Please excuse Wynona Canes form work this day as he needed transportation and care for 24 hours.  Sincerely,    Trenton Gammon RN

## 2023-03-18 NOTE — Interval H&P Note (Signed)
History and Physical Interval Note:  03/18/2023 8:03 AM  Jason Mclaughlin  has presented today for surgery, with the diagnosis of HX RECTAL.  The various methods of treatment have been discussed with the patient and family. After consideration of risks, benefits and other options for treatment, the patient has consented to  Procedure(s) with comments: COLONOSCOPY WITH PROPOFOL (N/A) - 815am, asa 3, via colostomy as a surgical intervention.  The patient's history has been reviewed, patient examined, no change in status, stable for surgery.  I have reviewed the patient's chart and labs.  Questions were answered to the patient's satisfaction.     Jeanni Allshouse  No change.  Here for surveillance colonoscopy per plan.  The risks, benefits, limitations, alternatives and imponderables have been reviewed with the patient. Questions have been answered. All parties are agreeable.

## 2023-03-19 LAB — SURGICAL PATHOLOGY

## 2023-03-19 NOTE — Anesthesia Postprocedure Evaluation (Signed)
Anesthesia Post Note  Patient: Jason Mclaughlin  Procedure(s) Performed: COLONOSCOPY WITH PROPOFOL POLYPECTOMY INTESTINAL  Patient location during evaluation: Phase II Anesthesia Type: General Level of consciousness: awake Pain management: pain level controlled Vital Signs Assessment: post-procedure vital signs reviewed and stable Respiratory status: spontaneous breathing and respiratory function stable Cardiovascular status: blood pressure returned to baseline and stable Postop Assessment: no headache and no apparent nausea or vomiting Anesthetic complications: no Comments: Late entry   No notable events documented.   Last Vitals:  Vitals:   03/18/23 0730 03/18/23 0910  BP: 126/89 109/68  Pulse:  71  Resp: 13 18  Temp:  (!) 35.7 C  SpO2: 96% 95%    Last Pain:  Vitals:   03/18/23 0910  TempSrc: Axillary  PainSc: 0-No pain                 Windell Norfolk

## 2023-03-20 ENCOUNTER — Ambulatory Visit (HOSPITAL_COMMUNITY)
Admission: RE | Admit: 2023-03-20 | Discharge: 2023-03-20 | Disposition: A | Discharge status: Home / Self Care | Payer: 59 | Source: Ambulatory Visit | Attending: General Surgery | Admitting: General Surgery

## 2023-03-20 ENCOUNTER — Encounter (HOSPITAL_COMMUNITY): Payer: Self-pay | Admitting: Internal Medicine

## 2023-03-20 DIAGNOSIS — C20 Malignant neoplasm of rectum: Secondary | ICD-10-CM | POA: Diagnosis present

## 2023-03-20 MED ORDER — IOHEXOL 300 MG/ML  SOLN
100.0000 mL | Freq: Once | INTRAMUSCULAR | Status: AC | PRN
  Administered 2023-03-20: 100 mL via INTRAVENOUS

## 2023-04-02 ENCOUNTER — Other Ambulatory Visit: Payer: Self-pay | Admitting: General Surgery

## 2023-04-02 DIAGNOSIS — K621 Rectal polyp: Secondary | ICD-10-CM

## 2023-04-02 DIAGNOSIS — C189 Malignant neoplasm of colon, unspecified: Secondary | ICD-10-CM

## 2023-04-02 DIAGNOSIS — K7689 Other specified diseases of liver: Secondary | ICD-10-CM

## 2023-04-02 DIAGNOSIS — C20 Malignant neoplasm of rectum: Secondary | ICD-10-CM

## 2023-04-02 DIAGNOSIS — K635 Polyp of colon: Secondary | ICD-10-CM

## 2023-05-16 ENCOUNTER — Ambulatory Visit
Admission: RE | Admit: 2023-05-16 | Discharge: 2023-05-16 | Disposition: A | Discharge status: Home / Self Care | Payer: 59 | Source: Ambulatory Visit | Attending: General Surgery | Admitting: General Surgery

## 2023-05-16 DIAGNOSIS — C189 Malignant neoplasm of colon, unspecified: Secondary | ICD-10-CM

## 2023-05-16 DIAGNOSIS — C20 Malignant neoplasm of rectum: Secondary | ICD-10-CM

## 2023-05-16 DIAGNOSIS — K635 Polyp of colon: Secondary | ICD-10-CM

## 2023-05-16 DIAGNOSIS — K7689 Other specified diseases of liver: Secondary | ICD-10-CM

## 2023-05-16 DIAGNOSIS — K621 Rectal polyp: Secondary | ICD-10-CM

## 2023-05-16 MED ORDER — GADOPICLENOL 0.5 MMOL/ML IV SOLN
8.0000 mL | Freq: Once | INTRAVENOUS | Status: AC | PRN
  Administered 2023-05-16: 8 mL via INTRAVENOUS

## 2023-08-06 ENCOUNTER — Encounter: Payer: Self-pay | Admitting: *Deleted

## 2023-08-25 ENCOUNTER — Telehealth: Payer: Self-pay | Admitting: *Deleted

## 2023-08-25 NOTE — Telephone Encounter (Signed)
Patient is due for his 6 month colonoscopy per last surgical pathology result note 02/2023. "All polyps benign. Please let him know.  Because he has a large number of polyps, he needs another colonoscopy in 6mos.  Need double time on the schedule.  If he continues to smoke, he needs to STOP!"   Dr. Jena Gauss, same orders as previous, ASA 3?

## 2023-08-26 ENCOUNTER — Encounter: Payer: Self-pay | Admitting: *Deleted

## 2023-08-26 MED ORDER — NA SULFATE-K SULFATE-MG SULF 17.5-3.13-1.6 GM/177ML PO SOLN: ORAL | 0 refills | Status: AC

## 2023-08-26 NOTE — Addendum Note (Signed)
Addended by: Armstead Peaks on: 08/26/2023 08:57 AM   Modules accepted: Orders

## 2023-08-26 NOTE — Telephone Encounter (Signed)
Questionnaire from recall, no referral needed

## 2023-08-26 NOTE — Telephone Encounter (Signed)
Spoke with pt. He has been scheduled for 3/12. Aware will send instructions and pre-op appt. Rx for prep sent to pharmacy. He was a triage

## 2023-09-29 ENCOUNTER — Encounter (HOSPITAL_COMMUNITY): Payer: Self-pay

## 2023-09-29 ENCOUNTER — Other Ambulatory Visit: Payer: Self-pay

## 2023-09-29 NOTE — Pre-Procedure Instructions (Signed)
 Attempted pre-op phone call. Left VM for him to call us back.

## 2023-09-30 ENCOUNTER — Encounter (HOSPITAL_COMMUNITY)
Admission: RE | Admit: 2023-09-30 | Discharge: 2023-09-30 | Disposition: A | Discharge status: Home / Self Care | Payer: 59 | Source: Ambulatory Visit | Attending: Internal Medicine | Admitting: Internal Medicine

## 2023-10-07 ENCOUNTER — Ambulatory Visit (HOSPITAL_COMMUNITY): Admitting: Anesthesiology

## 2023-10-07 ENCOUNTER — Encounter (HOSPITAL_COMMUNITY): Payer: Self-pay | Admitting: Internal Medicine

## 2023-10-07 ENCOUNTER — Encounter (HOSPITAL_COMMUNITY)
Admission: RE | Disposition: A | Discharge status: Home / Self Care | Payer: Self-pay | Source: Home / Self Care | Attending: Internal Medicine

## 2023-10-07 ENCOUNTER — Other Ambulatory Visit: Payer: Self-pay

## 2023-10-07 ENCOUNTER — Ambulatory Visit (HOSPITAL_COMMUNITY)
Admission: RE | Admit: 2023-10-07 | Discharge: 2023-10-07 | Disposition: A | Discharge status: Home / Self Care | Payer: 59 | Attending: Internal Medicine | Admitting: Internal Medicine

## 2023-10-07 DIAGNOSIS — J449 Chronic obstructive pulmonary disease, unspecified: Secondary | ICD-10-CM

## 2023-10-07 DIAGNOSIS — D123 Benign neoplasm of transverse colon: Secondary | ICD-10-CM | POA: Insufficient documentation

## 2023-10-07 DIAGNOSIS — Z1211 Encounter for screening for malignant neoplasm of colon: Secondary | ICD-10-CM

## 2023-10-07 DIAGNOSIS — F17210 Nicotine dependence, cigarettes, uncomplicated: Secondary | ICD-10-CM

## 2023-10-07 DIAGNOSIS — D120 Benign neoplasm of cecum: Secondary | ICD-10-CM | POA: Insufficient documentation

## 2023-10-07 DIAGNOSIS — Z09 Encounter for follow-up examination after completed treatment for conditions other than malignant neoplasm: Secondary | ICD-10-CM | POA: Diagnosis not present

## 2023-10-07 DIAGNOSIS — Z800 Family history of malignant neoplasm of digestive organs: Secondary | ICD-10-CM | POA: Diagnosis not present

## 2023-10-07 DIAGNOSIS — Z85038 Personal history of other malignant neoplasm of large intestine: Secondary | ICD-10-CM | POA: Diagnosis not present

## 2023-10-07 DIAGNOSIS — I10 Essential (primary) hypertension: Secondary | ICD-10-CM

## 2023-10-07 DIAGNOSIS — K6389 Other specified diseases of intestine: Secondary | ICD-10-CM

## 2023-10-07 DIAGNOSIS — Z860100 Personal history of colon polyps, unspecified: Secondary | ICD-10-CM

## 2023-10-07 HISTORY — PX: COLONOSCOPY WITH PROPOFOL: SHX5780

## 2023-10-07 HISTORY — PX: POLYPECTOMY: SHX5525

## 2023-10-07 SURGERY — COLONOSCOPY WITH PROPOFOL
Anesthesia: General

## 2023-10-07 MED ORDER — LIDOCAINE HCL (PF) 2 % IJ SOLN
INTRAMUSCULAR | Status: DC | PRN
  Administered 2023-10-07: 50 mg via INTRADERMAL

## 2023-10-07 MED ORDER — PROPOFOL 10 MG/ML IV BOLUS
INTRAVENOUS | Status: DC | PRN
  Administered 2023-10-07: 50 mg via INTRAVENOUS
  Administered 2023-10-07: 100 ug/kg/min via INTRAVENOUS

## 2023-10-07 MED ORDER — LACTATED RINGERS IV SOLN: INTRAVENOUS | Status: DC

## 2023-10-07 MED ORDER — PHENYLEPHRINE 80 MCG/ML (10ML) SYRINGE FOR IV PUSH (FOR BLOOD PRESSURE SUPPORT)
PREFILLED_SYRINGE | INTRAVENOUS | Status: DC | PRN
Start: 2023-10-07 — End: 2023-10-07
  Administered 2023-10-07: 160 ug via INTRAVENOUS

## 2023-10-07 MED ORDER — GLUCAGON HCL RDNA (DIAGNOSTIC) 1 MG IJ SOLR
INTRAMUSCULAR | Status: DC | PRN
  Administered 2023-10-07: .5 mg via INTRAVENOUS

## 2023-10-07 NOTE — Transfer of Care (Signed)
 Immediate Anesthesia Transfer of Care Note  Patient: Jason Mclaughlin  Procedure(s) Performed: COLONOSCOPY WITH PROPOFOL POLYPECTOMY  Patient Location: Endoscopy Unit  Anesthesia Type:General  Level of Consciousness: awake, alert , and oriented  Airway & Oxygen Therapy: Patient Spontanous Breathing  Post-op Assessment: Report given to RN and Post -op Vital signs reviewed and stable  Post vital signs: Reviewed and stable  Last Vitals:  Vitals Value Taken Time  BP    Temp    Pulse    Resp    SpO2      Last Pain:  Vitals:   10/07/23 0803  TempSrc: Oral  PainSc: 0-No pain         Complications: No notable events documented.

## 2023-10-07 NOTE — Discharge Instructions (Signed)
  Colonoscopy Discharge Instructions  Read the instructions outlined below and refer to this sheet in the next few weeks. These discharge instructions provide you with general information on caring for yourself after you leave the hospital. Your doctor may also give you specific instructions. While your treatment has been planned according to the most current medical practices available, unavoidable complications occasionally occur. If you have any problems or questions after discharge, call Dr. Jena Gauss at [phone]. ACTIVITY You may resume your regular activity, but move at a slower pace for the next 24 hours.  Take frequent rest periods for the next 24 hours.  Walking will help get rid of the air and reduce the bloated feeling in your belly (abdomen).  No driving for 24 hours (because of the medicine (anesthesia) used during the test).   Do not sign any important legal documents or operate any machinery for 24 hours (because of the anesthesia used during the test).  NUTRITION Drink plenty of fluids.  You may resume your normal diet as instructed by your doctor.  Begin with a light meal and progress to your normal diet. Heavy or fried foods are harder to digest and may make you feel sick to your stomach (nauseated).  Avoid alcoholic beverages for 24 hours or as instructed.  MEDICATIONS You may resume your normal medications unless your doctor tells you otherwise.  WHAT YOU CAN EXPECT TODAY Some feelings of bloating in the abdomen.  Passage of more gas than usual.  Spotting of blood in your stool or on the toilet paper.  IF YOU HAD POLYPS REMOVED DURING THE COLONOSCOPY: No aspirin products for 7 days or as instructed.  No alcohol for 7 days or as instructed.  Eat a soft diet for the next 24 hours.  FINDING OUT THE RESULTS OF YOUR TEST Not all test results are available during your visit. If your test results are not back during the visit, make an appointment with your caregiver to find out the  results. Do not assume everything is normal if you have not heard from your caregiver or the medical facility. It is important for you to follow up on all of your test results.  SEEK IMMEDIATE MEDICAL ATTENTION IF: You have more than a spotting of blood in your stool.  Your belly is swollen (abdominal distention).  You are nauseated or vomiting.  You have a temperature over 101.  You have abdominal pain or discomfort that is severe or gets worse throughout the day.     5 small polyps  found and removed from your colon today   further recommendations to follow pending review of pathology report    at patient request, I called Diane at [phone] findings and recommendations

## 2023-10-07 NOTE — Op Note (Signed)
 [facility] Patient Name: Jason Mclaughlin Procedure Date: 10/07/2023 9:43 AM MRN: [id number] Date of Birth: [birth year] Attending MD: Gennette Pac , MD, [id number] CSN: [id number] Age: 68 Admit Type: Outpatient Procedure:                Colonoscopy Indications:              High risk colon cancer surveillance: Personal                            history of colon cancer. Colonoscopy through ostomy Providers:                Gennette Pac, MD, Nena Polio, RN, Lennice Sites Technician, Technician Referring MD:              Medicines:                Propofol per Anesthesia Complications:            No immediate complications. Estimated Blood Loss:     Estimated blood loss was minimal. Procedure:                Pre-Anesthesia Assessment:                           - Prior to the procedure, a History and Physical                            was performed, and patient medications and                            allergies were reviewed. The patient's tolerance of                            previous anesthesia was also reviewed. The risks                            and benefits of the procedure and the sedation                            options and risks were discussed with the patient.                            All questions were answered, and informed consent                            was obtained. Prior Anticoagulants: The patient has                            taken no anticoagulant or antiplatelet agents. ASA                            Grade Assessment: III - A patient with severe  systemic disease. After reviewing the risks and                            benefits, the patient was deemed in satisfactory                            condition to undergo the procedure.                           After obtaining informed consent, the colonoscope                            was passed under direct vision. Throughout the                             procedure, the patient's blood pressure, pulse, and                            oxygen saturations were monitored continuously. The                            [id number]) scope was introduced through the                            anus and advanced to the the cecum, identified by                            appendiceal orifice and ileocecal valve. The                            colonoscopy was performed without difficulty. The                            patient tolerated the procedure well. The quality                            of the bowel preparation was adequate. The                            ileocecal valve and the appendiceal orifice were                            photographed. The patient tolerated the procedure                            well. Scope In: 10:10:07 AM Scope Out: 10:30:23 AM Scope Withdrawal Time: 0 hours 17 minutes 31 seconds  Total Procedure Duration: 0 hours 20 minutes 16 seconds  Findings:      Colonoscopy through colostomy.      Five sessile polyps were found in the hepatic flexure and ileocecal       valve. The polyps were 4 to 6 mm in size. These polyps were removed with       a cold snare. Resection and retrieval were complete. Estimated blood  loss was minimal. Scattered areas of submucosal petechiae in the distal       colon.      The exam was otherwise without abnormality. Impression:               - Five 4 to 6 mm polyps at the hepatic flexure and                            at the ileocecal valve, removed with a cold snare.                            Resected and retrieved. Colonic submucosal petechiae                           - The examination was otherwise normal. Moderate Sedation:      Moderate (conscious) sedation was personally administered by an       anesthesia professional. The following parameters were monitored: oxygen       saturation, heart rate, blood pressure, respiratory rate, EKG, adequacy       of  pulmonary ventilation, and response to care. Recommendation:           - Patient has a contact number available for                            emergencies. The signs and symptoms of potential                            delayed complications were discussed with the                            patient. Return to normal activities tomorrow.                            Written discharge instructions were provided to the                            patient.                           - Resume previous diet.                           - Repeat colonoscopy date to be determined after                            pending pathology results are reviewed for                            surveillance.                           - Return to GI office (date not yet determined). Procedure Code(s):        --- Professional ---                           [id number], Colonoscopy, flexible; with removal of  tumor(s), polyp(s), or other lesion(s) by snare                            technique Diagnosis Code(s):        --- Professional ---                           Q65.784, Personal history of other malignant                            neoplasm of large intestine                           D12.3, Benign neoplasm of transverse colon (hepatic                            flexure or splenic flexure)                           D12.0, Benign neoplasm of cecum CPT copyright 2022 American Medical Association. All rights reserved. The codes documented in this report are preliminary and upon coder review may  be revised to meet current compliance requirements. Gerrit Friends. Raguel Kosloski, MD Gennette Pac, MD 10/07/2023 10:39:08 AM This report has been signed electronically. Number of Addenda: 0

## 2023-10-07 NOTE — Anesthesia Preprocedure Evaluation (Signed)
 Anesthesia Evaluation  Patient identified by MRN, date of birth, ID band Patient awake    Reviewed: Allergy & Precautions, H&P , NPO status , Patient's Chart, lab work & pertinent test results, reviewed documented beta blocker date and time   Airway Mallampati: II  TM Distance: >3 FB Neck ROM: full    Dental no notable dental hx.    Pulmonary neg pulmonary ROS, pneumonia, COPD, Current Smoker and Patient abstained from smoking.   Pulmonary exam normal breath sounds clear to auscultation       Cardiovascular Exercise Tolerance: Good hypertension, negative cardio ROS  Rhythm:regular Rate:Normal     Neuro/Psych negative neurological ROS  negative psych ROS   GI/Hepatic negative GI ROS, Neg liver ROS,,,  Endo/Other  negative endocrine ROS    Renal/GU negative Renal ROS  negative genitourinary   Musculoskeletal   Abdominal   Peds  Hematology negative hematology ROS (+)   Anesthesia Other Findings   Reproductive/Obstetrics negative OB ROS                              Anesthesia Physical Anesthesia Plan  ASA: 3  Anesthesia Plan: General   Post-op Pain Management:    Induction:   PONV Risk Score and Plan: Propofol infusion  Airway Management Planned:   Additional Equipment:   Intra-op Plan:   Post-operative Plan:   Informed Consent: I have reviewed the patients History and Physical, chart, labs and discussed the procedure including the risks, benefits and alternatives for the proposed anesthesia with the patient or authorized representative who has indicated his/her understanding and acceptance.     Dental Advisory Given  Plan Discussed with: CRNA  Anesthesia Plan Comments:         Anesthesia Quick Evaluation

## 2023-10-07 NOTE — H&P (Signed)
 @LOGO @   Primary Care Physician:  Rebekah Chesterfield, NP Primary Gastroenterologist:  Dr. Jena Gauss  Pre-Procedure History & Physical: HPI:  Jason Mclaughlin is a 68 y.o. male here for   Surveillance colonoscopy history of adenocarcinoma of the rectum stage I status post APR 50 polyps removed at the time of diagnosis 1 year follow-up colonoscopy through colostomy last year did yielded 30 polyps.  He is here for surveillance examination.  Recent MRI of the liver showed no metastatic disease  Past Medical History:  Diagnosis Date   Arthritis    COPD (chronic obstructive pulmonary disease) (HCC)    Family history of colon cancer    Hypertension    Pneumonia    Polyposis coli    Tinnitus     Past Surgical History:  Procedure Laterality Date   BIOPSY  10/28/2021   Procedure: BIOPSY;  Surgeon: Corbin Ade, MD;  Location: [facility];  Service: Endoscopy;;   COLONOSCOPY WITH PROPOFOL N/A 10/28/2021   Procedure: COLONOSCOPY WITH PROPOFOL;  Surgeon: Corbin Ade, MD;  Location: [facility];  Service: Endoscopy;  Laterality: N/A;  2:15pm   COLONOSCOPY WITH PROPOFOL N/A 03/18/2023   Procedure: COLONOSCOPY WITH PROPOFOL;  Surgeon: Corbin Ade, MD;  Location: [facility];  Service: Endoscopy;  Laterality: N/A;  815am, asa 3, via colostomy   HOT HEMOSTASIS  10/28/2021   Procedure: HOT HEMOSTASIS (ARGON PLASMA COAGULATION/BICAP);  Surgeon: Corbin Ade, MD;  Location: [facility];  Service: Endoscopy;;   MULTIPLE TOOTH EXTRACTIONS     OSTOMY N/A 02/05/2022   Procedure: OSTOMY CREATION;  Surgeon: Romie Levee, MD;  Location: [facility];  Service: General;  Laterality: N/A;   POLYPECTOMY  10/28/2021   Procedure: POLYPECTOMY;  Surgeon: Corbin Ade, MD;  Location: [facility];  Service: Endoscopy;;   POLYPECTOMY  03/18/2023   Procedure: POLYPECTOMY INTESTINAL;  Surgeon: Corbin Ade, MD;  Location: [facility];  Service: Endoscopy;;   XI ROBOTIC ASSISTED LOWER ANTERIOR  RESECTION N/A 02/05/2022   Procedure: XI ROBOTIC ASSISTED ABDOMINAL PERINEAL RESECTION;  Surgeon: Romie Levee, MD;  Location: [facility];  Service: General;  Laterality: N/A;    Prior to Admission medications   Medication Sig Start Date End Date Taking? Authorizing Provider  amLODipine (NORVASC) 10 MG tablet Take 10 mg by mouth daily. 10/03/21  Yes [provider]  bisacodyl (DULCOLAX) 5 MG EC tablet Take 5 mg by mouth 4 (four) times daily as needed for moderate constipation.   Yes [provider]  BREZTRI AEROSPHERE 160-9-4.8 MCG/ACT AERO Inhale 2 puffs into the lungs 2 (two) times daily. 10/03/21  Yes [provider]  Cyanocobalamin (VITAMIN B-12) 3000 MCG SUBL Place 3,000 Units under the tongue daily.   Yes [provider]  ferrous sulfate 324 MG TBEC Take 324 mg by mouth.   Yes [provider]  loratadine (CLARITIN) 10 MG tablet Take 10 mg by mouth daily.   Yes [provider]  Multiple Vitamins-Minerals (MULTIVITAMIN WITH MINERALS) tablet Take 1 tablet by mouth daily.   Yes [provider]  tamsulosin (FLOMAX) 0.4 MG CAPS capsule Take 0.4 mg by mouth daily.   Yes [provider]  Na Sulfate-K Sulfate-Mg Sulfate concentrate 17.5-3.13-1.6 GM/177ML SOLN As directed 08/26/23   Dniya Neuhaus, Gerrit Friends, MD  tadalafil (CIALIS) 5 MG tablet Take 5 mg by mouth daily as needed for erectile dysfunction. 10/03/21   [provider]    Allergies as of 08/26/2023   (  No Known Allergies)    Family History  Problem Relation Age of Onset   Lung cancer Mother    Colon cancer Father        late 67s   Brain cancer Maternal Grandmother     Social History   Socioeconomic History   Marital status: Married    Spouse name: Not on file   Number of children: Not on file   Years of education: Not on file   Highest education level: Not on file  Occupational History   Not on file  Tobacco Use   Smoking status: Every Day    Current  packs/day: 1.00    Average packs/day: 1 pack/day for 55.2 years (55.2 ttl pk-yrs)    Types: Cigarettes    Start date: 17   Smokeless tobacco: Current    Types: Snuff  Vaping Use   Vaping status: Never Used  Substance and Sexual Activity   Alcohol use: Yes    Alcohol/week: 1.0 standard drink of alcohol    Types: 1 Cans of beer per week   Drug use: Never   Sexual activity: Never  Other Topics Concern   Not on file  Social History Narrative   ** Merged History Encounter **       Social Drivers of Corporate investment banker Strain: Not on file  Food Insecurity: Not on file  Transportation Needs: Not on file  Physical Activity: Not on file  Stress: Not on file  Social Connections: Not on file  Intimate Partner Violence: Not on file    Review of Systems: See HPI, otherwise negative ROS  Physical Exam: BP 118/75   Pulse 68   Temp 98.1 F (36.7 C) (Oral)   Resp 19   Ht 5\' 8"  (1.727 m)   Wt 73.9 kg   SpO2 95%   BMI 24.79 kg/m  General:   Alert,  Well-developed, well-nourished, pleasant and cooperative in NAD Abdomen: Non-distended, normal bowel sounds.  Soft and nontender without appreciable mass or hepatosplenomegaly.  Pulses:  Normal pulses noted. Extremities:  Without clubbing or edema.  Impression/Plan:   six 52-year-old with a history stage I rectal cancer status post APR.  Large polyp burden.  30 polyps removed postop 1 year ago he is here for surveillance colonoscopy through his colostomy per plan.  The risks, benefits, limitations, alternatives and imponderables have been reviewed with the patient. Questions have been answered. All parties are agreeable.       Notice: This dictation was prepared with Dragon dictation along with smaller phrase technology. Any transcriptional errors that result from this process are unintentional and may not be corrected upon review.

## 2023-10-08 ENCOUNTER — Encounter (HOSPITAL_COMMUNITY): Payer: Self-pay | Admitting: Internal Medicine

## 2023-10-09 LAB — SURGICAL PATHOLOGY

## 2023-10-09 NOTE — Anesthesia Postprocedure Evaluation (Signed)
 Anesthesia Post Note  Patient: Jason Mclaughlin  Procedure(s) Performed: COLONOSCOPY WITH PROPOFOL POLYPECTOMY  Patient location during evaluation: Phase II Anesthesia Type: General Level of consciousness: awake Pain management: pain level controlled Vital Signs Assessment: post-procedure vital signs reviewed and stable Respiratory status: spontaneous breathing and respiratory function stable Cardiovascular status: blood pressure returned to baseline and stable Postop Assessment: no headache and no apparent nausea or vomiting Anesthetic complications: no Comments: Late entry   No notable events documented.   Last Vitals:  Vitals:   10/07/23 0803 10/07/23 1039  BP: 118/75 107/73  Pulse: 68 77  Resp: 19 (!) 21  Temp: 36.7 C 36.6 C  SpO2: 95% 99%    Last Pain:  Vitals:   10/07/23 1039  TempSrc: Axillary  PainSc: 0-No pain                 Windell Norfolk

## 2023-10-11 ENCOUNTER — Encounter: Payer: Self-pay | Admitting: Internal Medicine

## 2024-06-17 ENCOUNTER — Other Ambulatory Visit: Payer: Self-pay
